# Patient Record
Sex: Female | Born: 1980 | Race: White | Hispanic: Yes | Smoking: Former smoker
Health system: Southern US, Community
[De-identification: ages and names within clinical notes are randomized; demographics above are authoritative.]

## PROBLEM LIST (undated history)

## (undated) ENCOUNTER — Emergency Department (HOSPITAL_COMMUNITY): Payer: Self-pay

## (undated) DIAGNOSIS — N840 Polyp of corpus uteri: Secondary | ICD-10-CM

## (undated) DIAGNOSIS — D649 Anemia, unspecified: Secondary | ICD-10-CM

## (undated) DIAGNOSIS — N809 Endometriosis, unspecified: Secondary | ICD-10-CM

## (undated) DIAGNOSIS — K859 Acute pancreatitis without necrosis or infection, unspecified: Secondary | ICD-10-CM

## (undated) HISTORY — PX: CHOLECYSTECTOMY, LAPAROSCOPIC: SHX56

## (undated) HISTORY — PX: TUBAL LIGATION: SHX77

## (undated) HISTORY — PX: CHOLECYSTECTOMY: SHX55

---
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
## 2001-04-25 ENCOUNTER — Emergency Department (HOSPITAL_COMMUNITY): Admission: EM | Admit: 2001-04-25 | Discharge: 2001-04-25 | Payer: Self-pay

## 2001-10-11 ENCOUNTER — Emergency Department (HOSPITAL_COMMUNITY): Admission: EM | Admit: 2001-10-11 | Discharge: 2001-10-11 | Payer: Self-pay | Admitting: Emergency Medicine

## 2002-04-22 ENCOUNTER — Emergency Department (HOSPITAL_COMMUNITY): Admission: EM | Admit: 2002-04-22 | Discharge: 2002-04-22 | Payer: Self-pay | Admitting: Emergency Medicine

## 2002-04-24 ENCOUNTER — Ambulatory Visit (HOSPITAL_COMMUNITY): Admission: RE | Admit: 2002-04-24 | Discharge: 2002-04-24 | Payer: Self-pay | Admitting: *Deleted

## 2002-04-25 ENCOUNTER — Inpatient Hospital Stay (HOSPITAL_COMMUNITY): Admission: AD | Admit: 2002-04-25 | Discharge: 2002-04-28 | Payer: Self-pay | Admitting: *Deleted

## 2002-04-26 ENCOUNTER — Encounter: Payer: Self-pay | Admitting: *Deleted

## 2002-05-26 ENCOUNTER — Inpatient Hospital Stay (HOSPITAL_COMMUNITY): Admission: AD | Admit: 2002-05-26 | Discharge: 2002-05-26 | Payer: Self-pay | Admitting: *Deleted

## 2002-07-12 ENCOUNTER — Ambulatory Visit (HOSPITAL_COMMUNITY): Admission: RE | Admit: 2002-07-12 | Discharge: 2002-07-12 | Payer: Self-pay | Admitting: *Deleted

## 2002-08-09 ENCOUNTER — Inpatient Hospital Stay (HOSPITAL_COMMUNITY): Admission: AD | Admit: 2002-08-09 | Discharge: 2002-08-09 | Payer: Self-pay | Admitting: *Deleted

## 2002-08-17 ENCOUNTER — Inpatient Hospital Stay (HOSPITAL_COMMUNITY): Admission: AD | Admit: 2002-08-17 | Discharge: 2002-08-17 | Payer: Self-pay | Admitting: Family Medicine

## 2002-08-30 ENCOUNTER — Inpatient Hospital Stay (HOSPITAL_COMMUNITY): Admission: AD | Admit: 2002-08-30 | Discharge: 2002-08-30 | Payer: Self-pay | Admitting: Obstetrics and Gynecology

## 2002-10-03 ENCOUNTER — Inpatient Hospital Stay (HOSPITAL_COMMUNITY): Admission: AD | Admit: 2002-10-03 | Discharge: 2002-10-05 | Payer: Self-pay | Admitting: *Deleted

## 2002-10-03 ENCOUNTER — Encounter (INDEPENDENT_AMBULATORY_CARE_PROVIDER_SITE_OTHER): Payer: Self-pay | Admitting: *Deleted

## 2002-10-06 ENCOUNTER — Encounter: Admission: RE | Admit: 2002-10-06 | Discharge: 2002-11-05 | Payer: Self-pay | Admitting: *Deleted

## 2002-10-12 ENCOUNTER — Inpatient Hospital Stay (HOSPITAL_COMMUNITY): Admission: AD | Admit: 2002-10-12 | Discharge: 2002-10-12 | Payer: Self-pay | Admitting: Family Medicine

## 2002-11-06 ENCOUNTER — Encounter: Admission: RE | Admit: 2002-11-06 | Discharge: 2002-12-06 | Payer: Self-pay | Admitting: *Deleted

## 2002-11-16 ENCOUNTER — Inpatient Hospital Stay (HOSPITAL_COMMUNITY): Admission: EM | Admit: 2002-11-16 | Discharge: 2002-11-18 | Payer: Self-pay | Admitting: Emergency Medicine

## 2002-11-16 ENCOUNTER — Encounter (INDEPENDENT_AMBULATORY_CARE_PROVIDER_SITE_OTHER): Payer: Self-pay | Admitting: *Deleted

## 2002-11-16 ENCOUNTER — Encounter: Payer: Self-pay | Admitting: Emergency Medicine

## 2002-11-16 ENCOUNTER — Emergency Department (HOSPITAL_COMMUNITY): Admission: EM | Admit: 2002-11-16 | Discharge: 2002-11-16 | Payer: Self-pay | Admitting: Emergency Medicine

## 2002-11-16 ENCOUNTER — Encounter: Payer: Self-pay | Admitting: General Surgery

## 2003-01-06 ENCOUNTER — Encounter: Admission: RE | Admit: 2003-01-06 | Discharge: 2003-02-05 | Payer: Self-pay | Admitting: *Deleted

## 2003-02-06 ENCOUNTER — Encounter: Admission: RE | Admit: 2003-02-06 | Discharge: 2003-03-08 | Payer: Self-pay | Admitting: *Deleted

## 2003-03-25 ENCOUNTER — Inpatient Hospital Stay (HOSPITAL_COMMUNITY): Admission: AD | Admit: 2003-03-25 | Discharge: 2003-03-25 | Payer: Self-pay | Admitting: Obstetrics & Gynecology

## 2003-04-08 ENCOUNTER — Encounter: Admission: RE | Admit: 2003-04-08 | Discharge: 2003-05-08 | Payer: Self-pay | Admitting: *Deleted

## 2003-04-14 ENCOUNTER — Emergency Department (HOSPITAL_COMMUNITY): Admission: EM | Admit: 2003-04-14 | Discharge: 2003-04-14 | Payer: Self-pay | Admitting: Emergency Medicine

## 2003-06-08 ENCOUNTER — Encounter: Admission: RE | Admit: 2003-06-08 | Discharge: 2003-07-08 | Payer: Self-pay | Admitting: *Deleted

## 2003-07-03 ENCOUNTER — Inpatient Hospital Stay (HOSPITAL_COMMUNITY): Admission: AD | Admit: 2003-07-03 | Discharge: 2003-07-03 | Payer: Self-pay | Admitting: Obstetrics and Gynecology

## 2003-07-09 ENCOUNTER — Encounter: Admission: RE | Admit: 2003-07-09 | Discharge: 2003-08-08 | Payer: Self-pay | Admitting: *Deleted

## 2003-08-07 ENCOUNTER — Inpatient Hospital Stay (HOSPITAL_COMMUNITY): Admission: AD | Admit: 2003-08-07 | Discharge: 2003-08-08 | Payer: Self-pay | Admitting: Obstetrics & Gynecology

## 2003-08-12 ENCOUNTER — Inpatient Hospital Stay (HOSPITAL_COMMUNITY): Admission: AD | Admit: 2003-08-12 | Discharge: 2003-08-12 | Payer: Self-pay | Admitting: Obstetrics and Gynecology

## 2003-09-06 ENCOUNTER — Encounter: Admission: RE | Admit: 2003-09-06 | Discharge: 2003-10-06 | Payer: Self-pay | Admitting: *Deleted

## 2003-10-07 ENCOUNTER — Ambulatory Visit (HOSPITAL_COMMUNITY): Admission: RE | Admit: 2003-10-07 | Discharge: 2003-10-07 | Payer: Self-pay | Admitting: *Deleted

## 2003-10-14 ENCOUNTER — Other Ambulatory Visit: Admission: RE | Admit: 2003-10-14 | Discharge: 2003-10-14 | Payer: Self-pay | Admitting: *Deleted

## 2003-12-05 ENCOUNTER — Inpatient Hospital Stay (HOSPITAL_COMMUNITY): Admission: AD | Admit: 2003-12-05 | Discharge: 2003-12-08 | Payer: Self-pay | Admitting: Obstetrics and Gynecology

## 2004-01-18 ENCOUNTER — Inpatient Hospital Stay (HOSPITAL_COMMUNITY): Admission: AD | Admit: 2004-01-18 | Discharge: 2004-01-18 | Payer: Self-pay | Admitting: Obstetrics & Gynecology

## 2004-01-23 ENCOUNTER — Inpatient Hospital Stay (HOSPITAL_COMMUNITY): Admission: AD | Admit: 2004-01-23 | Discharge: 2004-01-24 | Payer: Self-pay | Admitting: Obstetrics and Gynecology

## 2004-01-31 ENCOUNTER — Encounter: Admission: RE | Admit: 2004-01-31 | Discharge: 2004-03-01 | Payer: Self-pay | Admitting: Obstetrics and Gynecology

## 2004-02-24 ENCOUNTER — Other Ambulatory Visit: Admission: RE | Admit: 2004-02-24 | Discharge: 2004-02-24 | Payer: Self-pay | Admitting: Obstetrics and Gynecology

## 2004-03-02 ENCOUNTER — Encounter: Admission: RE | Admit: 2004-03-02 | Discharge: 2004-04-01 | Payer: Self-pay | Admitting: Obstetrics and Gynecology

## 2004-03-18 ENCOUNTER — Emergency Department (HOSPITAL_COMMUNITY): Admission: EM | Admit: 2004-03-18 | Discharge: 2004-03-18 | Payer: Self-pay | Admitting: Emergency Medicine

## 2004-06-02 ENCOUNTER — Emergency Department (HOSPITAL_COMMUNITY): Admission: EM | Admit: 2004-06-02 | Discharge: 2004-06-02 | Payer: Self-pay | Admitting: Emergency Medicine

## 2004-10-11 ENCOUNTER — Emergency Department (HOSPITAL_COMMUNITY): Admission: EM | Admit: 2004-10-11 | Discharge: 2004-10-11 | Payer: Self-pay | Admitting: Emergency Medicine

## 2005-06-17 ENCOUNTER — Inpatient Hospital Stay (HOSPITAL_COMMUNITY): Admission: AD | Admit: 2005-06-17 | Discharge: 2005-06-17 | Payer: Self-pay | Admitting: Obstetrics & Gynecology

## 2005-08-01 ENCOUNTER — Ambulatory Visit: Payer: Self-pay | Admitting: Obstetrics and Gynecology

## 2005-08-01 ENCOUNTER — Inpatient Hospital Stay (HOSPITAL_COMMUNITY): Admission: AD | Admit: 2005-08-01 | Discharge: 2005-08-01 | Payer: Self-pay | Admitting: Family Medicine

## 2005-08-17 ENCOUNTER — Inpatient Hospital Stay (HOSPITAL_COMMUNITY): Admission: AD | Admit: 2005-08-17 | Discharge: 2005-08-17 | Payer: Self-pay | Admitting: Obstetrics and Gynecology

## 2005-08-18 ENCOUNTER — Ambulatory Visit: Payer: Self-pay | Admitting: Certified Nurse Midwife

## 2005-08-18 ENCOUNTER — Inpatient Hospital Stay (HOSPITAL_COMMUNITY): Admission: AD | Admit: 2005-08-18 | Discharge: 2005-08-19 | Payer: Self-pay | Admitting: Gynecology

## 2005-08-23 ENCOUNTER — Ambulatory Visit: Payer: Self-pay | Admitting: *Deleted

## 2005-08-23 ENCOUNTER — Encounter (INDEPENDENT_AMBULATORY_CARE_PROVIDER_SITE_OTHER): Payer: Self-pay | Admitting: *Deleted

## 2005-08-30 ENCOUNTER — Inpatient Hospital Stay (HOSPITAL_COMMUNITY): Admission: AD | Admit: 2005-08-30 | Discharge: 2005-08-31 | Payer: Self-pay | Admitting: *Deleted

## 2005-08-30 ENCOUNTER — Ambulatory Visit: Payer: Self-pay | Admitting: Obstetrics and Gynecology

## 2005-09-06 ENCOUNTER — Ambulatory Visit: Payer: Self-pay | Admitting: Obstetrics & Gynecology

## 2005-09-13 ENCOUNTER — Ambulatory Visit: Payer: Self-pay | Admitting: Obstetrics & Gynecology

## 2005-09-17 ENCOUNTER — Inpatient Hospital Stay (HOSPITAL_COMMUNITY): Admission: AD | Admit: 2005-09-17 | Discharge: 2005-09-17 | Payer: Self-pay | Admitting: Gynecology

## 2005-09-17 ENCOUNTER — Ambulatory Visit: Payer: Self-pay | Admitting: Family Medicine

## 2005-09-20 ENCOUNTER — Ambulatory Visit: Payer: Self-pay | Admitting: Obstetrics & Gynecology

## 2005-09-21 ENCOUNTER — Ambulatory Visit: Payer: Self-pay | Admitting: Family Medicine

## 2005-09-21 ENCOUNTER — Inpatient Hospital Stay (HOSPITAL_COMMUNITY): Admission: AD | Admit: 2005-09-21 | Discharge: 2005-09-21 | Payer: Self-pay | Admitting: Obstetrics and Gynecology

## 2005-09-27 ENCOUNTER — Ambulatory Visit: Payer: Self-pay | Admitting: Obstetrics & Gynecology

## 2005-10-04 ENCOUNTER — Ambulatory Visit: Payer: Self-pay | Admitting: Obstetrics & Gynecology

## 2005-10-04 ENCOUNTER — Inpatient Hospital Stay (HOSPITAL_COMMUNITY): Admission: AD | Admit: 2005-10-04 | Discharge: 2005-10-04 | Payer: Self-pay | Admitting: Gynecology

## 2005-10-07 ENCOUNTER — Ambulatory Visit: Payer: Self-pay | Admitting: Gynecology

## 2005-10-07 ENCOUNTER — Inpatient Hospital Stay (HOSPITAL_COMMUNITY): Admission: AD | Admit: 2005-10-07 | Discharge: 2005-10-09 | Payer: Self-pay | Admitting: *Deleted

## 2006-05-14 ENCOUNTER — Emergency Department (HOSPITAL_COMMUNITY): Admission: EM | Admit: 2006-05-14 | Discharge: 2006-05-14 | Payer: Self-pay | Admitting: Emergency Medicine

## 2006-05-24 ENCOUNTER — Inpatient Hospital Stay (HOSPITAL_COMMUNITY): Admission: EM | Admit: 2006-05-24 | Discharge: 2006-05-27 | Payer: Self-pay | Admitting: Emergency Medicine

## 2006-05-31 ENCOUNTER — Inpatient Hospital Stay (HOSPITAL_COMMUNITY): Admission: EM | Admit: 2006-05-31 | Discharge: 2006-06-04 | Payer: Self-pay | Admitting: Emergency Medicine

## 2006-09-01 ENCOUNTER — Emergency Department (HOSPITAL_COMMUNITY): Admission: EM | Admit: 2006-09-01 | Discharge: 2006-09-01 | Payer: Self-pay | Admitting: Emergency Medicine

## 2006-11-05 ENCOUNTER — Emergency Department (HOSPITAL_COMMUNITY): Admission: EM | Admit: 2006-11-05 | Discharge: 2006-11-05 | Payer: Self-pay | Admitting: Emergency Medicine

## 2006-12-09 ENCOUNTER — Emergency Department (HOSPITAL_COMMUNITY): Admission: EM | Admit: 2006-12-09 | Discharge: 2006-12-09 | Payer: Self-pay | Admitting: *Deleted

## 2006-12-16 ENCOUNTER — Emergency Department (HOSPITAL_COMMUNITY): Admission: EM | Admit: 2006-12-16 | Discharge: 2006-12-16 | Payer: Self-pay | Admitting: Emergency Medicine

## 2006-12-27 ENCOUNTER — Inpatient Hospital Stay (HOSPITAL_COMMUNITY): Admission: AD | Admit: 2006-12-27 | Discharge: 2006-12-27 | Payer: Self-pay | Admitting: Obstetrics & Gynecology

## 2006-12-31 ENCOUNTER — Inpatient Hospital Stay (HOSPITAL_COMMUNITY): Admission: AD | Admit: 2006-12-31 | Discharge: 2006-12-31 | Payer: Self-pay | Admitting: Family Medicine

## 2007-01-03 ENCOUNTER — Ambulatory Visit (HOSPITAL_COMMUNITY): Admission: RE | Admit: 2007-01-03 | Discharge: 2007-01-03 | Payer: Self-pay | Admitting: Obstetrics and Gynecology

## 2007-01-24 ENCOUNTER — Inpatient Hospital Stay (HOSPITAL_COMMUNITY): Admission: AD | Admit: 2007-01-24 | Discharge: 2007-01-25 | Payer: Self-pay | Admitting: Obstetrics & Gynecology

## 2007-03-22 ENCOUNTER — Inpatient Hospital Stay (HOSPITAL_COMMUNITY): Admission: AD | Admit: 2007-03-22 | Discharge: 2007-03-22 | Payer: Self-pay | Admitting: Obstetrics and Gynecology

## 2007-05-05 ENCOUNTER — Inpatient Hospital Stay (HOSPITAL_COMMUNITY): Admission: AD | Admit: 2007-05-05 | Discharge: 2007-05-06 | Payer: Self-pay | Admitting: Obstetrics and Gynecology

## 2007-06-13 ENCOUNTER — Inpatient Hospital Stay (HOSPITAL_COMMUNITY): Admission: AD | Admit: 2007-06-13 | Discharge: 2007-06-19 | Payer: Self-pay | Admitting: Obstetrics and Gynecology

## 2007-06-25 ENCOUNTER — Inpatient Hospital Stay (HOSPITAL_COMMUNITY): Admission: AD | Admit: 2007-06-25 | Discharge: 2007-06-25 | Payer: Self-pay | Admitting: Obstetrics

## 2007-07-07 ENCOUNTER — Inpatient Hospital Stay (HOSPITAL_COMMUNITY): Admission: AD | Admit: 2007-07-07 | Discharge: 2007-07-07 | Payer: Self-pay | Admitting: Obstetrics & Gynecology

## 2007-07-16 ENCOUNTER — Observation Stay (HOSPITAL_COMMUNITY): Admission: AD | Admit: 2007-07-16 | Discharge: 2007-07-16 | Payer: Self-pay | Admitting: Obstetrics & Gynecology

## 2007-07-19 ENCOUNTER — Ambulatory Visit (HOSPITAL_COMMUNITY): Admission: RE | Admit: 2007-07-19 | Discharge: 2007-07-19 | Payer: Self-pay | Admitting: Obstetrics & Gynecology

## 2007-07-21 ENCOUNTER — Inpatient Hospital Stay (HOSPITAL_COMMUNITY): Admission: AD | Admit: 2007-07-21 | Discharge: 2007-07-21 | Payer: Self-pay | Admitting: Obstetrics

## 2007-07-25 ENCOUNTER — Encounter: Payer: Self-pay | Admitting: Obstetrics & Gynecology

## 2007-07-25 ENCOUNTER — Inpatient Hospital Stay (HOSPITAL_COMMUNITY): Admission: AD | Admit: 2007-07-25 | Discharge: 2007-07-27 | Payer: Self-pay | Admitting: Obstetrics & Gynecology

## 2007-12-17 ENCOUNTER — Emergency Department (HOSPITAL_COMMUNITY): Admission: EM | Admit: 2007-12-17 | Discharge: 2007-12-17 | Payer: Self-pay | Admitting: Family Medicine

## 2008-11-08 ENCOUNTER — Emergency Department (HOSPITAL_COMMUNITY): Admission: EM | Admit: 2008-11-08 | Discharge: 2008-11-08 | Payer: Self-pay | Admitting: *Deleted

## 2008-11-08 ENCOUNTER — Emergency Department (HOSPITAL_COMMUNITY): Admission: EM | Admit: 2008-11-08 | Discharge: 2008-11-08 | Payer: Self-pay | Admitting: Emergency Medicine

## 2008-11-09 ENCOUNTER — Observation Stay (HOSPITAL_COMMUNITY): Admission: EM | Admit: 2008-11-09 | Discharge: 2008-11-11 | Payer: Self-pay | Admitting: Emergency Medicine

## 2009-01-31 ENCOUNTER — Emergency Department (HOSPITAL_COMMUNITY): Admission: EM | Admit: 2009-01-31 | Discharge: 2009-02-01 | Payer: Self-pay | Admitting: Emergency Medicine

## 2009-04-06 IMAGING — CT CT ABDOMEN W/O CM
2 of 6 series · 13 of 32 positions shown, 19 images · IV contrast (agent unspecified)
Comparison: Abdominal CT 05/25/2006

CLINICAL DATA: Right back and flank pain.  History of cholecystectomy.  Question ureteral calculus.  
ABDOMEN CT WITHOUT CONTRAST:
TECHNIQUE: Multidetector CT imaging of the abdomen was performed following the standard protocol without IV contrast.
TECHNIQUE: Multidetector CT imaging of the pelvis was performed following the standard protocol without IV contrast.

[Series 4: recon 3: use smart ma with max · axial · 0.81mm/px · z∈[-490,-36]mm · 11 of 437 slices shown, 17 images]
[im 37/437  soft-tissue]
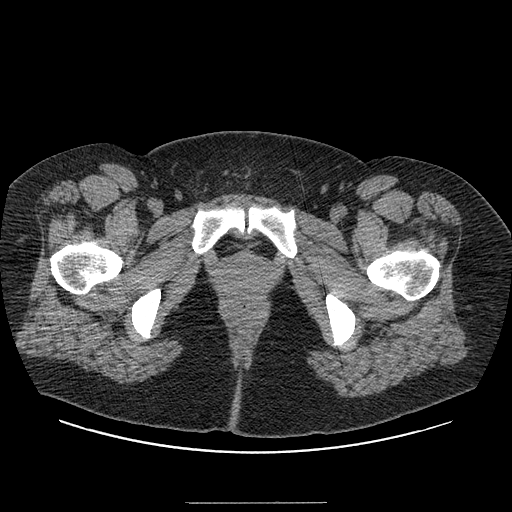
[im 37/437  bone]
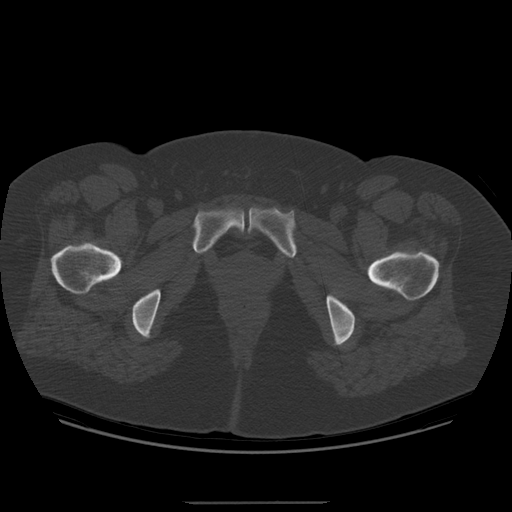
[im 73/437  soft-tissue]
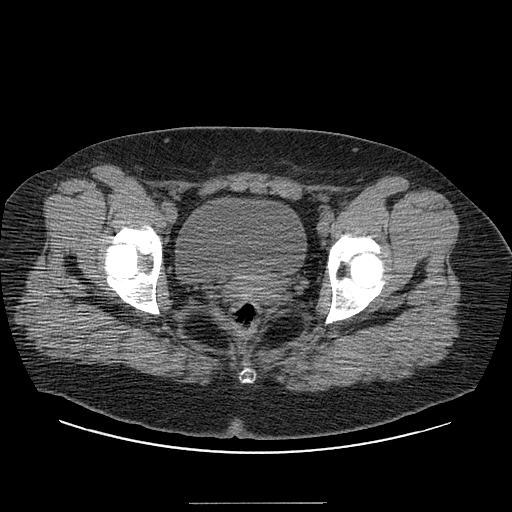
[im 110/437  soft-tissue]
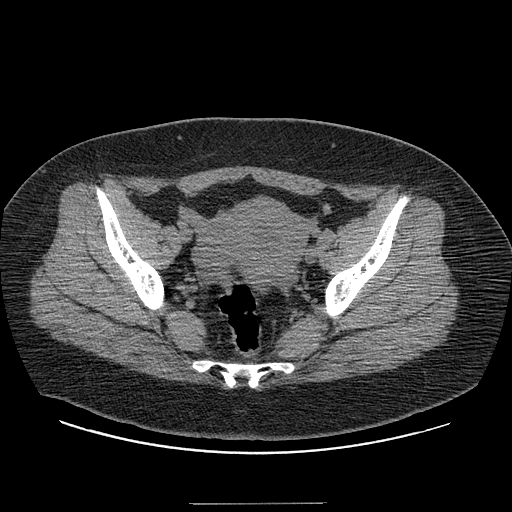
[im 146/437  soft-tissue]
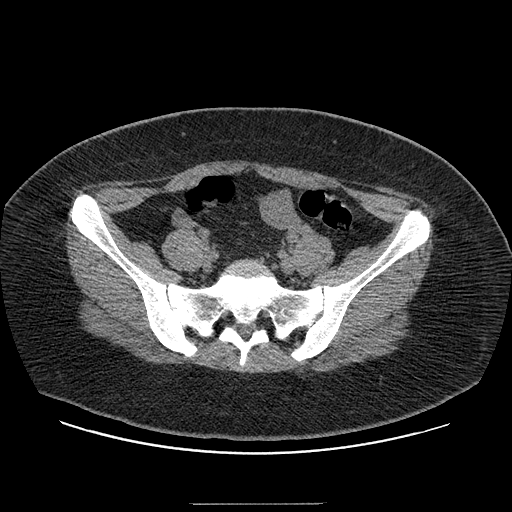
[im 182/437  soft-tissue]
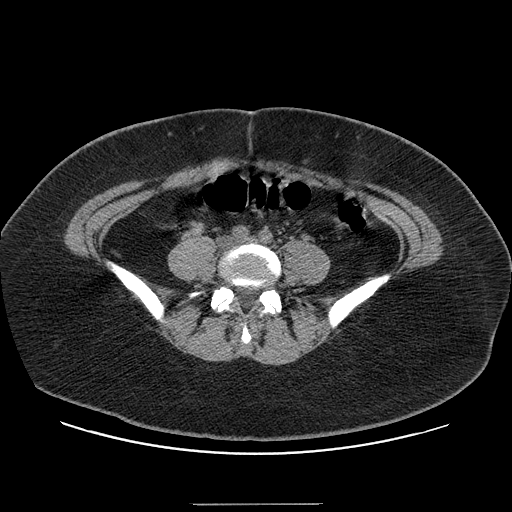
[im 219/437  soft-tissue]
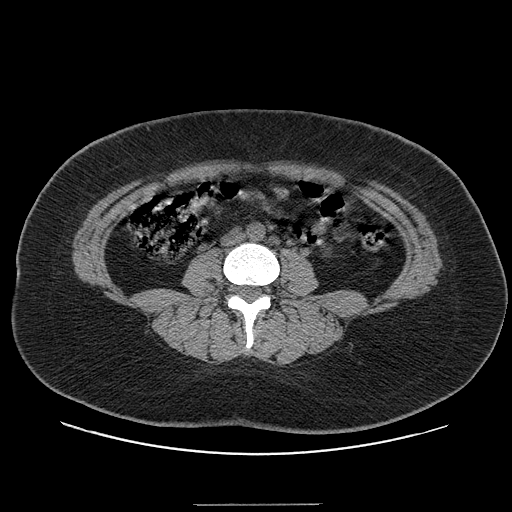
[im 255/437  soft-tissue]
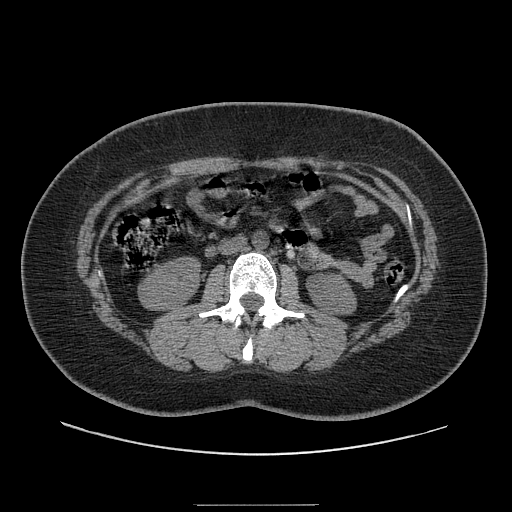
[im 291/437  soft-tissue]
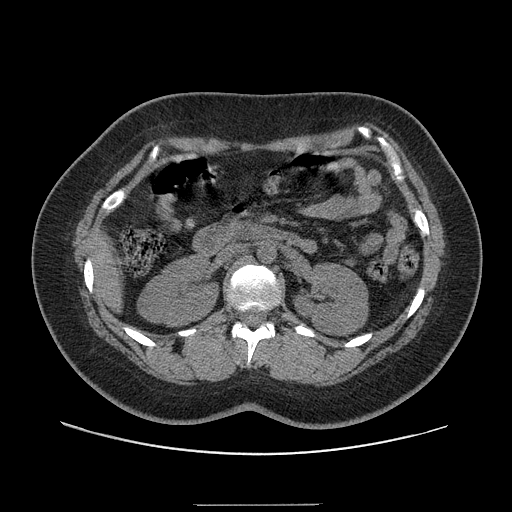
[im 291/437  lung]
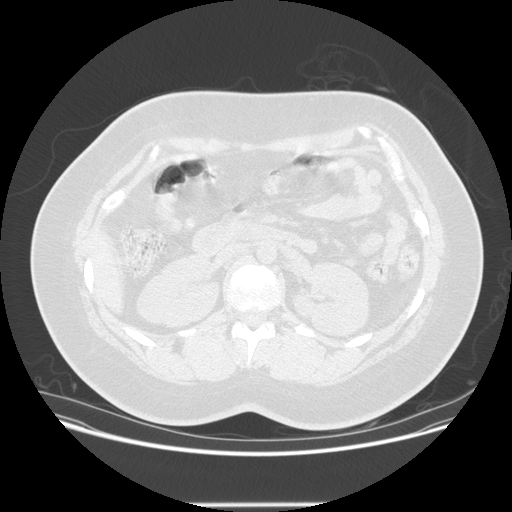
[im 328/437  soft-tissue]
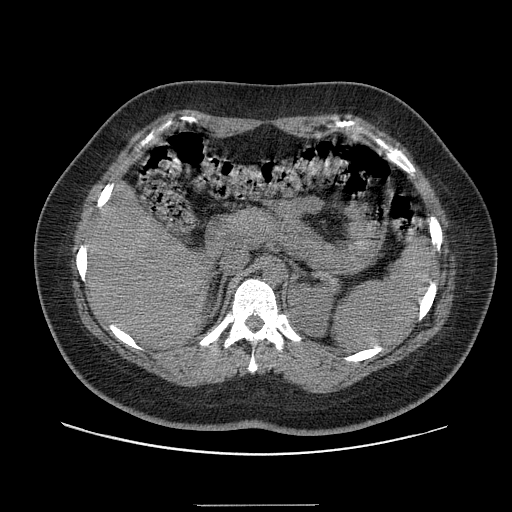
[im 328/437  lung]
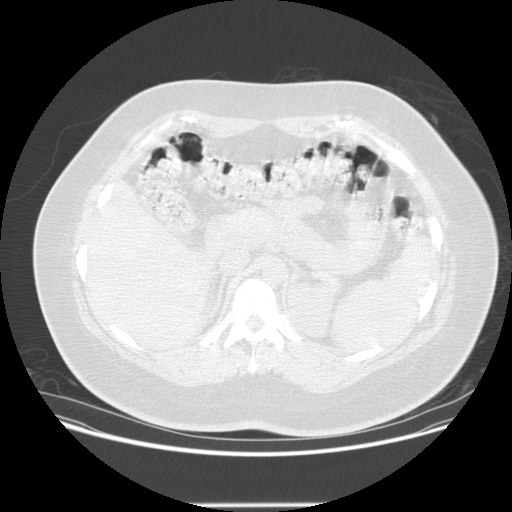
[im 328/437  bone]
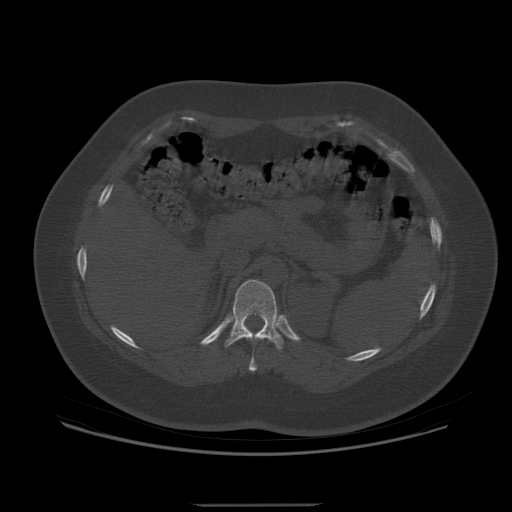
[im 364/437  soft-tissue]
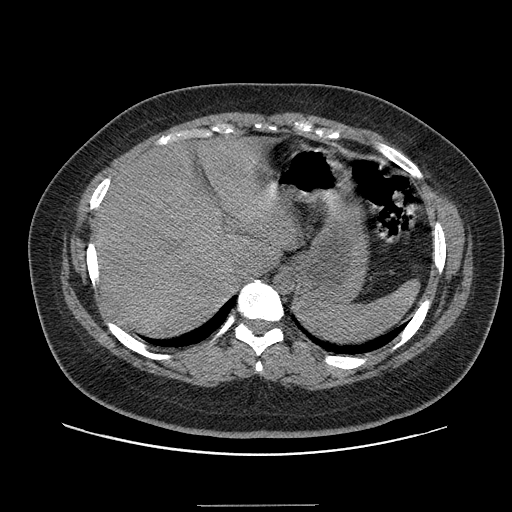
[im 364/437  lung]
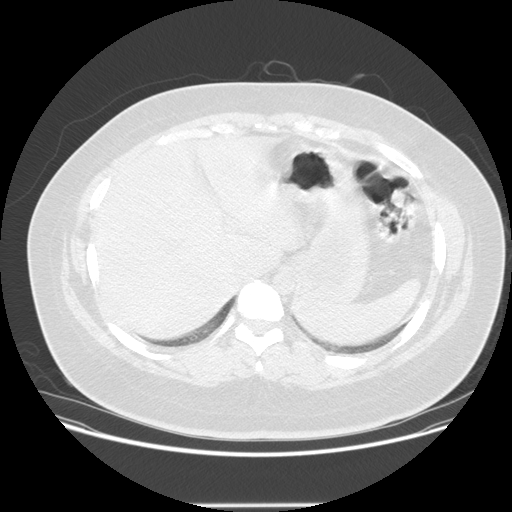
[im 400/437  soft-tissue]
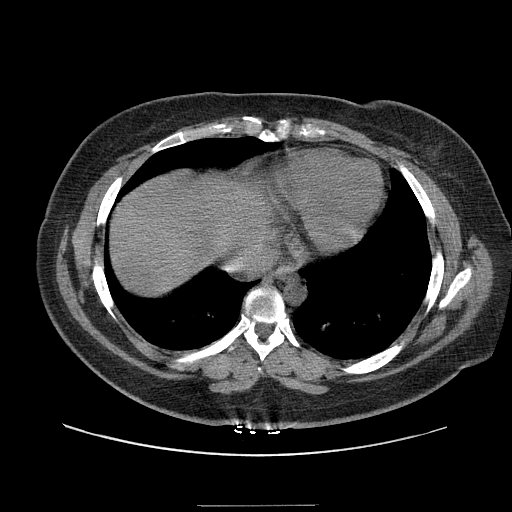
[im 400/437  lung]
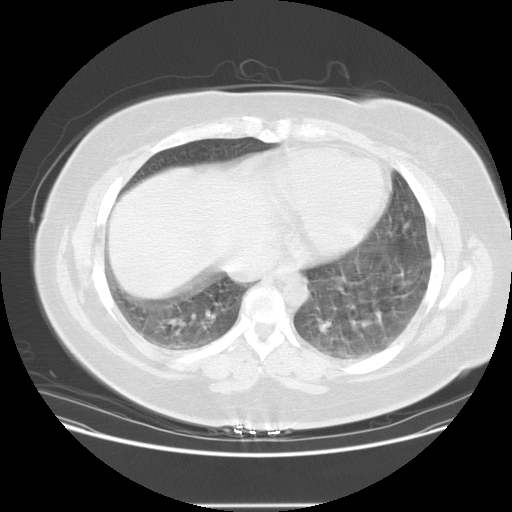

[Series 400: sag · sagittal · 0.80mm/px · 2 of 177 slices shown]
[im 45/177  soft-tissue]
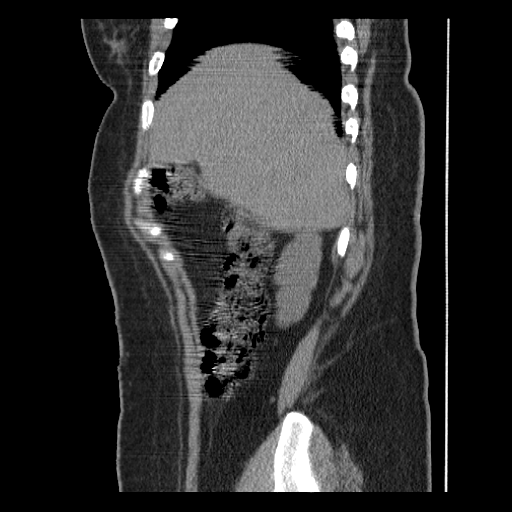
[im 89/177  soft-tissue]
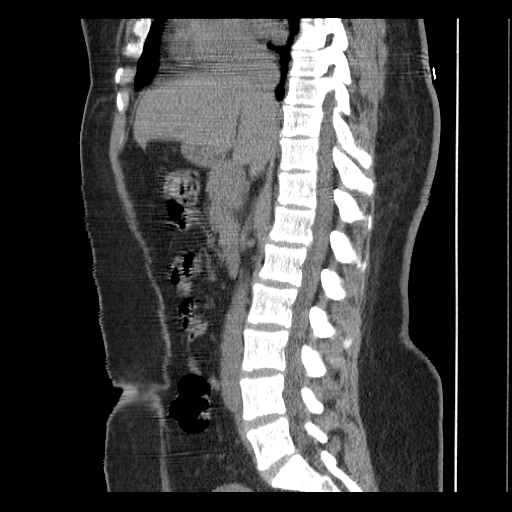

[13 of 32 positions shown; findings below may reference images not displayed]

FINDINGS: The lung bases are now clear.  The kidneys appear normal. No urinary tract calculi are seen.  There is no hydronephrosis or perinephric soft tissue stranding.  The gallbladder is surgically absent.  As imaged in the unenhanced state, the liver, spleen, adrenal glands, and pancreas appear normal.  No intraabdominal inflammatory changes are seen.
IMPRESSION: Negative for urinary tract calculus or hydronephrosis.  No acute abdominal findings are seen.   Bibasilar atelectasis has resolved.  
PELVIS CT WITHOUT CONTRAST:
FINDINGS: Distally, the ureters are normal in caliber.  No ureteral or bladder calculi are seen.  There is no evidence of pelvic inflammatory process.  The free pelvic fluid and right adnexal enlargement noted on the prior examination are no longer present.  The left ovary now appears slightly larger than the right, but the surrounding fat planes are intact, and this is likely physiologic.
IMPRESSION: Negative for ureteral calculus or hydronephrosis.  No acute pelvic findings.

## 2009-04-09 ENCOUNTER — Emergency Department (HOSPITAL_COMMUNITY): Admission: EM | Admit: 2009-04-09 | Discharge: 2009-04-09 | Payer: Self-pay | Admitting: Emergency Medicine

## 2009-04-21 ENCOUNTER — Emergency Department (HOSPITAL_COMMUNITY): Admission: EM | Admit: 2009-04-21 | Discharge: 2009-04-21 | Payer: Self-pay | Admitting: Emergency Medicine

## 2009-11-17 ENCOUNTER — Emergency Department (HOSPITAL_COMMUNITY): Admission: EM | Admit: 2009-11-17 | Discharge: 2009-11-17 | Payer: Self-pay | Admitting: Emergency Medicine

## 2009-11-20 ENCOUNTER — Emergency Department (HOSPITAL_COMMUNITY): Admission: EM | Admit: 2009-11-20 | Discharge: 2009-11-20 | Payer: Self-pay | Admitting: Family Medicine

## 2009-12-09 ENCOUNTER — Emergency Department (HOSPITAL_COMMUNITY): Admission: EM | Admit: 2009-12-09 | Discharge: 2009-12-10 | Payer: Self-pay | Admitting: Emergency Medicine

## 2010-02-01 ENCOUNTER — Emergency Department (HOSPITAL_COMMUNITY): Admission: EM | Admit: 2010-02-01 | Discharge: 2010-02-02 | Payer: Self-pay | Admitting: Emergency Medicine

## 2010-02-01 ENCOUNTER — Emergency Department (HOSPITAL_COMMUNITY): Admission: EM | Admit: 2010-02-01 | Discharge: 2010-02-01 | Payer: Self-pay | Admitting: Family Medicine

## 2010-02-01 ENCOUNTER — Encounter (INDEPENDENT_AMBULATORY_CARE_PROVIDER_SITE_OTHER): Payer: Self-pay | Admitting: *Deleted

## 2010-04-27 ENCOUNTER — Inpatient Hospital Stay (HOSPITAL_COMMUNITY)
Admission: AD | Admit: 2010-04-27 | Discharge: 2010-04-27 | Payer: Self-pay | Source: Home / Self Care | Admitting: Obstetrics & Gynecology

## 2010-05-18 ENCOUNTER — Ambulatory Visit: Payer: Self-pay | Admitting: Obstetrics & Gynecology

## 2010-06-11 ENCOUNTER — Emergency Department (HOSPITAL_COMMUNITY)
Admission: EM | Admit: 2010-06-11 | Discharge: 2010-06-12 | Payer: Self-pay | Source: Home / Self Care | Admitting: Emergency Medicine

## 2010-06-11 LAB — DIFFERENTIAL
Basophils Absolute: 0 10*3/uL (ref 0.0–0.1)
Basophils Relative: 0 % (ref 0–1)
Eosinophils Absolute: 0.1 10*3/uL (ref 0.0–0.7)
Eosinophils Relative: 2 % (ref 0–5)
Lymphocytes Relative: 35 % (ref 12–46)
Lymphs Abs: 2.1 10*3/uL (ref 0.7–4.0)
Monocytes Absolute: 0.6 10*3/uL (ref 0.1–1.0)
Monocytes Relative: 10 % (ref 3–12)
Neutro Abs: 3.2 10*3/uL (ref 1.7–7.7)
Neutrophils Relative %: 54 % (ref 43–77)

## 2010-06-11 LAB — CBC
HCT: 34.3 % — ABNORMAL LOW (ref 36.0–46.0)
Hemoglobin: 11.6 g/dL — ABNORMAL LOW (ref 12.0–15.0)
MCH: 29.4 pg (ref 26.0–34.0)
MCHC: 33.8 g/dL (ref 30.0–36.0)
MCV: 86.8 fL (ref 78.0–100.0)
Platelets: 216 10*3/uL (ref 150–400)
RBC: 3.95 MIL/uL (ref 3.87–5.11)
RDW: 12.4 % (ref 11.5–15.5)
WBC: 6 10*3/uL (ref 4.0–10.5)

## 2010-06-11 LAB — POCT PREGNANCY, URINE: Preg Test, Ur: NEGATIVE

## 2010-06-11 LAB — URINALYSIS, ROUTINE W REFLEX MICROSCOPIC
Bilirubin Urine: NEGATIVE
Ketones, ur: NEGATIVE mg/dL
Nitrite: NEGATIVE
Protein, ur: NEGATIVE mg/dL
Specific Gravity, Urine: 1.005 (ref 1.005–1.030)
Urine Glucose, Fasting: NEGATIVE mg/dL
Urobilinogen, UA: 0.2 mg/dL (ref 0.0–1.0)
pH: 7.5 (ref 5.0–8.0)

## 2010-06-11 LAB — URINE MICROSCOPIC-ADD ON

## 2010-06-12 ENCOUNTER — Emergency Department (HOSPITAL_COMMUNITY)
Admission: EM | Admit: 2010-06-12 | Discharge: 2010-06-12 | Payer: Self-pay | Source: Home / Self Care | Admitting: Emergency Medicine

## 2010-06-12 ENCOUNTER — Encounter (INDEPENDENT_AMBULATORY_CARE_PROVIDER_SITE_OTHER): Payer: Self-pay | Admitting: *Deleted

## 2010-06-12 LAB — COMPREHENSIVE METABOLIC PANEL
ALT: 19 U/L (ref 0–35)
AST: 17 U/L (ref 0–37)
Albumin: 3.6 g/dL (ref 3.5–5.2)
Alkaline Phosphatase: 73 U/L (ref 39–117)
BUN: 10 mg/dL (ref 6–23)
CO2: 30 mEq/L (ref 19–32)
Calcium: 9.3 mg/dL (ref 8.4–10.5)
Chloride: 105 mEq/L (ref 96–112)
Creatinine, Ser: 0.69 mg/dL (ref 0.4–1.2)
GFR calc Af Amer: >60 mL/min (ref >60)
GFR calc non Af Amer: >60 mL/min (ref >60)
Glucose, Bld: 96 mg/dL (ref 70–99)
Potassium: 3.5 mEq/L (ref 3.5–5.1)
Sodium: 140 mEq/L (ref 135–145)
Total Bilirubin: 1.3 mg/dL — ABNORMAL HIGH (ref 0.3–1.2)
Total Protein: 6.4 g/dL (ref 6.0–8.3)

## 2010-06-12 LAB — LIPASE, BLOOD: Lipase: 26 U/L (ref 11–59)

## 2010-07-06 ENCOUNTER — Emergency Department (HOSPITAL_COMMUNITY)
Admission: EM | Admit: 2010-07-06 | Discharge: 2010-07-07 | Payer: Self-pay | Source: Home / Self Care | Admitting: Emergency Medicine

## 2010-07-07 ENCOUNTER — Emergency Department (HOSPITAL_COMMUNITY)
Admission: EM | Admit: 2010-07-07 | Discharge: 2010-07-07 | Payer: Self-pay | Source: Home / Self Care | Admitting: Family Medicine

## 2010-07-09 NOTE — Letter (Signed)
Summary: New Patient letter  [facility]  [address]   [place], [place] [address]   Phone: [phone]  Fax: [phone]       06/12/2010 MRN: [id number]  [facility] [address] [place], [place]  [address]  Dear Ms. Hulsebus,  Welcome to the Gastroenterology Division at [facility].    You are scheduled to see Dr.  Christella Hartigan on 07/20/2010 at 2:00 on the 3rd floor at [facility], [address]  We ask that you try to arrive at our office 15 minutes prior to your appointment time to allow for check-in.  We would like you to complete the enclosed self-administered evaluation form prior to your visit and bring it with you on the day of your appointment.  We will review it with you.  Also, please bring a complete list of all your medications or, if you prefer, bring the medication bottles and we will list them.  Please bring your insurance card so that we may make a copy of it.  If your insurance requires a referral to see a specialist, please bring your referral form from your primary care physician.  Co-payments are due at the time of your visit and may be paid by cash, check or credit card.     Your office visit will consist of a consult with your physician (includes a physical exam), any laboratory testing he/she may order, scheduling of any necessary diagnostic testing (e.g. x-ray, ultrasound, CT-scan), and scheduling of a procedure (e.g. Endoscopy, Colonoscopy) if required.  Please allow enough time on your schedule to allow for any/all of these possibilities.    If you cannot keep your appointment, please call [phone] to cancel or reschedule prior to your appointment date.  This allows [place] the opportunity to schedule an appointment for another patient in need of care.  If you do not cancel or reschedule by 5 p.m. the business day prior to your appointment date, you will be charged a $50.00 late cancellation/no-show fee.    Thank you for choosing  [facility] for your medical needs.  We appreciate the opportunity to care for you.  Please visit [place] at our website  to learn more about our practice.                     Sincerely,                                                             The Gastroenterology Division

## 2010-07-20 ENCOUNTER — Ambulatory Visit: Payer: Self-pay | Admitting: Gastroenterology

## 2010-08-05 ENCOUNTER — Emergency Department (HOSPITAL_COMMUNITY)
Admission: EM | Admit: 2010-08-05 | Discharge: 2010-08-05 | Disposition: A | Discharge status: Home / Self Care | Payer: Self-pay | Attending: Emergency Medicine | Admitting: Emergency Medicine

## 2010-08-05 DIAGNOSIS — A5901 Trichomonal vulvovaginitis: Secondary | ICD-10-CM | POA: Insufficient documentation

## 2010-08-05 DIAGNOSIS — R109 Unspecified abdominal pain: Secondary | ICD-10-CM | POA: Insufficient documentation

## 2010-08-05 DIAGNOSIS — N72 Inflammatory disease of cervix uteri: Secondary | ICD-10-CM | POA: Insufficient documentation

## 2010-08-05 DIAGNOSIS — E0590 Thyrotoxicosis, unspecified without thyrotoxic crisis or storm: Secondary | ICD-10-CM | POA: Insufficient documentation

## 2010-08-05 LAB — URINE MICROSCOPIC-ADD ON

## 2010-08-05 LAB — URINALYSIS, ROUTINE W REFLEX MICROSCOPIC
Specific Gravity, Urine: 1.015 (ref 1.005–1.030)
Urine Glucose, Fasting: NEGATIVE mg/dL
pH: 6.5 (ref 5.0–8.0)

## 2010-08-19 LAB — URINALYSIS, ROUTINE W REFLEX MICROSCOPIC
Glucose, UA: NEGATIVE mg/dL
Hgb urine dipstick: NEGATIVE
Ketones, ur: NEGATIVE mg/dL
Protein, ur: NEGATIVE mg/dL

## 2010-08-19 LAB — POCT PREGNANCY, URINE: Preg Test, Ur: NEGATIVE

## 2010-08-19 LAB — WET PREP, GENITAL: Yeast Wet Prep HPF POC: NONE SEEN

## 2010-08-20 LAB — COMPREHENSIVE METABOLIC PANEL
ALT: 19 U/L (ref 0–35)
AST: 18 U/L (ref 0–37)
Albumin: 4 g/dL (ref 3.5–5.2)
CO2: 25 mEq/L (ref 19–32)
Chloride: 104 mEq/L (ref 96–112)
GFR calc Af Amer: >60 mL/min (ref >60)
GFR calc non Af Amer: >60 mL/min (ref >60)
Sodium: 135 mEq/L (ref 135–145)
Total Bilirubin: 1.5 mg/dL — ABNORMAL HIGH (ref 0.3–1.2)

## 2010-08-20 LAB — CBC
Hemoglobin: 13.5 g/dL (ref 12.0–15.0)
RBC: 4.59 MIL/uL (ref 3.87–5.11)
WBC: 11.4 10*3/uL — ABNORMAL HIGH (ref 4.0–10.5)

## 2010-08-20 LAB — POCT URINALYSIS DIPSTICK
Protein, ur: NEGATIVE mg/dL
Specific Gravity, Urine: 1.02 (ref 1.005–1.030)
Urobilinogen, UA: 4 mg/dL — ABNORMAL HIGH (ref 0.0–1.0)
pH: 8.5 — ABNORMAL HIGH (ref 5.0–8.0)

## 2010-08-20 LAB — DIFFERENTIAL
Eosinophils Absolute: 0.1 10*3/uL (ref 0.0–0.7)
Eosinophils Relative: 0 % (ref 0–5)
Lymphs Abs: 1.4 10*3/uL (ref 0.7–4.0)
Monocytes Absolute: 0.9 10*3/uL (ref 0.1–1.0)

## 2010-08-20 LAB — LIPASE, BLOOD: Lipase: 21 U/L (ref 11–59)

## 2010-08-20 LAB — POCT PREGNANCY, URINE: Preg Test, Ur: NEGATIVE

## 2010-08-23 LAB — POCT PREGNANCY, URINE: Preg Test, Ur: NEGATIVE

## 2010-08-23 LAB — POCT URINALYSIS DIP (DEVICE)
Hgb urine dipstick: NEGATIVE
Ketones, ur: NEGATIVE mg/dL
Protein, ur: NEGATIVE mg/dL
Specific Gravity, Urine: 1.015 (ref 1.005–1.030)
Urobilinogen, UA: 0.2 mg/dL (ref 0.0–1.0)
pH: 6.5 (ref 5.0–8.0)

## 2010-08-23 LAB — WET PREP, GENITAL

## 2010-08-23 LAB — GC/CHLAMYDIA PROBE AMP, GENITAL
Chlamydia, DNA Probe: NEGATIVE
GC Probe Amp, Genital: NEGATIVE

## 2010-08-24 LAB — CBC
Hemoglobin: 12.1 g/dL (ref 12.0–15.0)
MCHC: 33.8 g/dL (ref 30.0–36.0)
Platelets: 205 10*3/uL (ref 150–400)
RDW: 13.2 % (ref 11.5–15.5)

## 2010-08-24 LAB — COMPREHENSIVE METABOLIC PANEL
ALT: 17 U/L (ref 0–35)
Albumin: 3.7 g/dL (ref 3.5–5.2)
Alkaline Phosphatase: 57 U/L (ref 39–117)
Calcium: 8.9 mg/dL (ref 8.4–10.5)
Glucose, Bld: 86 mg/dL (ref 70–99)
Potassium: 3.4 mEq/L — ABNORMAL LOW (ref 3.5–5.1)
Sodium: 139 mEq/L (ref 135–145)
Total Protein: 6.7 g/dL (ref 6.0–8.3)

## 2010-08-24 LAB — URINALYSIS, ROUTINE W REFLEX MICROSCOPIC
Ketones, ur: NEGATIVE mg/dL
Nitrite: NEGATIVE
Protein, ur: NEGATIVE mg/dL
Urobilinogen, UA: 1 mg/dL (ref 0.0–1.0)

## 2010-08-24 LAB — DIFFERENTIAL
Basophils Relative: 1 % (ref 0–1)
Eosinophils Absolute: 0.3 10*3/uL (ref 0.0–0.7)
Lymphs Abs: 2.6 10*3/uL (ref 0.7–4.0)
Monocytes Absolute: 0.6 10*3/uL (ref 0.1–1.0)
Monocytes Relative: 10 % (ref 3–12)
Neutro Abs: 2.3 10*3/uL (ref 1.7–7.7)
Neutrophils Relative %: 40 % — ABNORMAL LOW (ref 43–77)

## 2010-08-24 LAB — POCT PREGNANCY, URINE: Preg Test, Ur: NEGATIVE

## 2010-09-09 LAB — URINALYSIS, ROUTINE W REFLEX MICROSCOPIC
Bilirubin Urine: NEGATIVE
Glucose, UA: NEGATIVE mg/dL
Hgb urine dipstick: NEGATIVE
Ketones, ur: NEGATIVE mg/dL
Nitrite: NEGATIVE
Protein, ur: NEGATIVE mg/dL
Specific Gravity, Urine: 1.021 (ref 1.005–1.030)
Urobilinogen, UA: 0.2 mg/dL (ref 0.0–1.0)
pH: 6 (ref 5.0–8.0)

## 2010-09-09 LAB — URINE MICROSCOPIC-ADD ON

## 2010-09-09 LAB — COMPREHENSIVE METABOLIC PANEL
BUN: 17 mg/dL (ref 6–23)
CO2: 26 mEq/L (ref 19–32)
Calcium: 8.9 mg/dL (ref 8.4–10.5)
Chloride: 106 mEq/L (ref 96–112)
Creatinine, Ser: 0.66 mg/dL (ref 0.4–1.2)
GFR calc non Af Amer: >60 mL/min (ref >60)
Glucose, Bld: 98 mg/dL (ref 70–99)
Total Bilirubin: 1 mg/dL (ref 0.3–1.2)

## 2010-09-09 LAB — DIFFERENTIAL
Basophils Absolute: 0 10*3/uL (ref 0.0–0.1)
Eosinophils Relative: 4 % (ref 0–5)
Lymphocytes Relative: 35 % (ref 12–46)
Lymphs Abs: 2.6 10*3/uL (ref 0.7–4.0)
Neutro Abs: 4 10*3/uL (ref 1.7–7.7)
Neutrophils Relative %: 53 % (ref 43–77)

## 2010-09-09 LAB — PREGNANCY, URINE
Preg Test, Ur: NEGATIVE
Preg Test, Ur: NEGATIVE

## 2010-09-09 LAB — CBC
HCT: 39.7 % (ref 36.0–46.0)
MCHC: 34.1 g/dL (ref 30.0–36.0)
MCV: 91.1 fL (ref 78.0–100.0)
RBC: 4.35 MIL/uL (ref 3.87–5.11)
WBC: 7.4 10*3/uL (ref 4.0–10.5)

## 2010-09-09 LAB — LIPASE, BLOOD: Lipase: 24 U/L (ref 11–59)

## 2010-09-09 LAB — WET PREP, GENITAL

## 2010-09-09 LAB — GC/CHLAMYDIA PROBE AMP, GENITAL: GC Probe Amp, Genital: POSITIVE — AB

## 2010-09-12 LAB — DIFFERENTIAL
Eosinophils Absolute: 0.3 10*3/uL (ref 0.0–0.7)
Lymphs Abs: 2.5 10*3/uL (ref 0.7–4.0)
Monocytes Absolute: 0.6 10*3/uL (ref 0.1–1.0)
Monocytes Relative: 10 % (ref 3–12)
Neutrophils Relative %: 44 % (ref 43–77)

## 2010-09-12 LAB — COMPREHENSIVE METABOLIC PANEL
ALT: 13 U/L (ref 0–35)
Albumin: 3.6 g/dL (ref 3.5–5.2)
Calcium: 8.8 mg/dL (ref 8.4–10.5)
GFR calc Af Amer: >60 mL/min (ref >60)
Glucose, Bld: 90 mg/dL (ref 70–99)
Sodium: 138 mEq/L (ref 135–145)
Total Protein: 6.3 g/dL (ref 6.0–8.3)

## 2010-09-12 LAB — CBC
Hemoglobin: 12.4 g/dL (ref 12.0–15.0)
MCHC: 33.7 g/dL (ref 30.0–36.0)
Platelets: 195 10*3/uL (ref 150–400)
RDW: 14.1 % (ref 11.5–15.5)

## 2010-09-12 LAB — RAPID URINE DRUG SCREEN, HOSP PERFORMED
Amphetamines: NOT DETECTED
Barbiturates: NOT DETECTED
Benzodiazepines: NOT DETECTED
Cocaine: NOT DETECTED
Opiates: POSITIVE — AB
Tetrahydrocannabinol: POSITIVE — AB

## 2010-09-12 LAB — URINE MICROSCOPIC-ADD ON

## 2010-09-12 LAB — URINALYSIS, ROUTINE W REFLEX MICROSCOPIC
Bilirubin Urine: NEGATIVE
Hgb urine dipstick: NEGATIVE
Nitrite: NEGATIVE
Protein, ur: NEGATIVE mg/dL
Urobilinogen, UA: 0.2 mg/dL (ref 0.0–1.0)
pH: 6 (ref 5.0–8.0)

## 2010-09-12 LAB — GLUCOSE, CAPILLARY: Glucose-Capillary: 100 mg/dL — ABNORMAL HIGH (ref 70–99)

## 2010-09-14 LAB — URINALYSIS, ROUTINE W REFLEX MICROSCOPIC
Bilirubin Urine: NEGATIVE
Bilirubin Urine: NEGATIVE
Glucose, UA: NEGATIVE mg/dL
Glucose, UA: NEGATIVE mg/dL
Hgb urine dipstick: NEGATIVE
Ketones, ur: NEGATIVE mg/dL
Ketones, ur: NEGATIVE mg/dL
Protein, ur: NEGATIVE mg/dL
Protein, ur: NEGATIVE mg/dL
Urobilinogen, UA: 1 mg/dL (ref 0.0–1.0)
pH: 7 (ref 5.0–8.0)

## 2010-09-14 LAB — BASIC METABOLIC PANEL
CO2: 25 mEq/L (ref 19–32)
Calcium: 8.4 mg/dL (ref 8.4–10.5)
Creatinine, Ser: 0.53 mg/dL (ref 0.4–1.2)
GFR calc Af Amer: >60 mL/min (ref >60)
GFR calc non Af Amer: >60 mL/min (ref >60)
Sodium: 141 mEq/L (ref 135–145)

## 2010-09-14 LAB — DIFFERENTIAL
Basophils Relative: 1 % (ref 0–1)
Eosinophils Absolute: 0.1 10*3/uL (ref 0.0–0.7)
Eosinophils Relative: 2 % (ref 0–5)
Eosinophils Relative: 2 % (ref 0–5)
Lymphocytes Relative: 32 % (ref 12–46)
Lymphs Abs: 1.8 10*3/uL (ref 0.7–4.0)
Lymphs Abs: 2.1 10*3/uL (ref 0.7–4.0)
Monocytes Relative: 10 % (ref 3–12)
Monocytes Relative: 6 % (ref 3–12)
Neutrophils Relative %: 70 % (ref 43–77)

## 2010-09-14 LAB — COMPREHENSIVE METABOLIC PANEL
ALT: 23 U/L (ref 0–35)
AST: 16 U/L (ref 0–37)
AST: 19 U/L (ref 0–37)
Alkaline Phosphatase: 71 U/L (ref 39–117)
CO2: 27 mEq/L (ref 19–32)
CO2: 30 mEq/L (ref 19–32)
Calcium: 8.7 mg/dL (ref 8.4–10.5)
Calcium: 9.1 mg/dL (ref 8.4–10.5)
Creatinine, Ser: 0.6 mg/dL (ref 0.4–1.2)
GFR calc Af Amer: >60 mL/min (ref >60)
GFR calc Af Amer: >60 mL/min (ref >60)
GFR calc non Af Amer: >60 mL/min (ref >60)
GFR calc non Af Amer: >60 mL/min (ref >60)
Glucose, Bld: 102 mg/dL — ABNORMAL HIGH (ref 70–99)
Potassium: 3.7 mEq/L (ref 3.5–5.1)
Sodium: 134 mEq/L — ABNORMAL LOW (ref 135–145)
Total Protein: 6.2 g/dL (ref 6.0–8.3)
Total Protein: 6.3 g/dL (ref 6.0–8.3)

## 2010-09-14 LAB — CBC
Hemoglobin: 13.1 g/dL (ref 12.0–15.0)
MCHC: 33.2 g/dL (ref 30.0–36.0)
MCHC: 33.5 g/dL (ref 30.0–36.0)
MCHC: 33.6 g/dL (ref 30.0–36.0)
MCV: 87.5 fL (ref 78.0–100.0)
Platelets: 183 10*3/uL (ref 150–400)
RBC: 3.86 MIL/uL — ABNORMAL LOW (ref 3.87–5.11)
RBC: 4.41 MIL/uL (ref 3.87–5.11)
RDW: 14 % (ref 11.5–15.5)

## 2010-09-14 LAB — URINE MICROSCOPIC-ADD ON

## 2010-09-14 LAB — URINE CULTURE: Colony Count: 30000

## 2010-09-14 LAB — LIPASE, BLOOD: Lipase: 26 U/L (ref 11–59)

## 2010-09-14 LAB — POCT PREGNANCY, URINE: Preg Test, Ur: NEGATIVE
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
## 2010-10-20 NOTE — Op Note (Signed)
[name], [name]               ACCOUNT NO.:  [id number]   MEDICAL RECORD NO.:  [id number]          PATIENT TYPE:  INP   LOCATION:  9104                          [facility]ILITY:  [facility]   PHYSICIAN:  Charles A. Clearance Coots, M.D.DATE OF BIRTH:  02-27-1981   DATE OF PROCEDURE:  07/25/2007  DATE OF DISCHARGE:                               OPERATIVE REPORT   PREOPERATIVE DIAGNOSIS:  Elective sterilization.   POSTOPERATIVE DIAGNOSIS:  Elective sterilization.   PROCEDURE:  Bilateral partial salpingectomy.   SURGEON:  Charles A. Clearance Coots, M.D.   ANESTHESIA:  Epidural.   ESTIMATED BLOOD LOSS:  Negligible.   COMPLICATIONS:  None.   SPECIMEN:  Approximately 2 cm segments of right and left fallopian  tubes.   DISPOSITION OF SPECIMEN:  To pathology.   OPERATION:  The patient was brought to operating room and after  satisfactory redosing of the epidural, the abdomen was prepped and  draped in the usual sterile fashion.  A small inferior umbilical  incision was made with the scalpel that was deepened down to the fascia  with curved Mayo scissors bluntly.  The fascia was grasped in the  midline with Kocher forceps and was cut transversely with curved Mayo  scissors.  The incision was extended to the left and to the right with  the curved Mayo scissors.  The peritoneum was grasped with hemostats and  was incised with Metzenbaum scissors.  Right-angle retractors were  placed in the incision.  The right fallopian tube was identified and was  grasped with a Babcock clamp.  The tube was followed from the corneal  end to the fimbrial end and grasped with Babcock clamps.  Knuckle of  tube beneath the Babcock clamp in the isthmic area of the tube was  suture ligated through the mesosalpinx with zero plain catgut.  A  section of tube above the knot was excised with Metzenbaum scissors and  submitted to pathology for evaluation.  There was no active bleeding  from the tubal stump.  It was therefore placed  back in its normal  anatomic position.  The same procedure was performed on the opposite  side without complications.  The abdomen was then closed as follows.  The fascia and peritoneum was closed as one with continuous suture of 2-  0 Vicryl, subcutaneous tissue was approximated with 2-0 Vicryl, the skin  was then closed with continuous subcuticular suture of 3-0 Monocryl.  Sterile bandage was applied to the incision closure.  Surgical  technician indicated that all needle, sponge and instrument counts were  correct x2.  The patient tolerated the procedure well, was transported  to the recovery room in satisfactory condition.      Charles A. Clearance Coots, M.D.  Electronically Signed     CAH/MEDQ  D:  07/25/2007  T:  07/26/2007  Job:  [id number]

## 2010-10-20 NOTE — Discharge Summary (Signed)
Phyllis Wilkins, Phyllis Wilkins               ACCOUNT NO.:  [id number]   MEDICAL RECORD NO.:  [id number]          PATIENT TYPE:  INP   LOCATION:  9152                          [facility]ILITY[facility]  [facility]   PHYSICIAN:  Roseanna Rainbow, M.D.DATE OF BIRTH:  [birth year]   DATE OF ADMISSION:  06/13/2007  DATE OF DISCHARGE:  06/19/2007                               DISCHARGE SUMMARY   CHIEF COMPLAINT:  The patient is a 30 year old gravida 6, para 5 with an  estimated date of confinement of March 11 complaining of uterine  contractions.   HISTORY OF PRESENT ILLNESS:  Please see the above.   ALLERGIES:  No known drug allergies.   MEDICATIONS:  Prenatal vitamins.   RISK FACTORS:  Late entry to care and grand multiparity.  Previous  preterm deliveries.   PRENATAL LABS:  Chlamydia probe negative, GC probe negative, 1 hour GTT  105, hepatitis B surface antigen negative, hemoglobin 11.6, HIV  nonreactive, platelet count 210,000, blood type is O positive, antibody  screen negative, RPR nonreactive, rubella immune, sickle cell negative,  varicella immune.   PAST OBSTETRICAL HISTORY:  In August 2000 she was delivered of a live  born female, 7 pounds 11 ounces, vaginal delivery.  In January 2002 she  was delivered of a live born female, 4 pounds 6 ounces at 33 weeks,  vaginal delivery.  In April of 2004 she was delivered of a 6 pounds female  at 35 weeks, vaginal delivery.  In June of 2005 she was delivered of a  live born female, 6 pounds 2 ounces at 34 weeks, vaginal delivery.  In May  of 2007 she was delivered of a live born female, 5 pounds 2 ounces at 34  weeks, vaginal delivery.   PAST GYN HISTORY:  Noncontributory.   PAST MEDICAL HISTORY:  Iron deficiency anemia, pancreatitis.   PAST SURGICAL HISTORY:  Cholecystectomy.   SOCIAL HISTORY:  She is unemployed, separated from her partner.  Does  not give any significant history of alcohol usage, has no significant  smoking history.  Denies illicit drug  use.   FAMILY HISTORY:  Adult onset diabetes, CVA.   PHYSICAL EXAMINATION:  VITAL SIGNS:  Stable, afebrile.  Fetal heart  tracing reassuring.  Tocodynamometer uterine contractions every 2-5  minutes, sterile vaginal exam per the RN - the cervix is 1 cm dilated  and long.   ASSESSMENT:  Threatened preterm labor without significant cervical  change.  Poor obstetrical history.   PLAN:  Admission, magnesium sulfate tocolysis.   HOSPITAL COURSE:  The patient was admitted.  She was adequately  tocolyzed with the magnesium sulfate.  There were initially labile blood  pressures.  However, these resolved with bedrest.  She was also given  broad spectrum parenteral antibiotics.  The magnesium and clindamycin  were discontinued on January 12.  She remained without uterine  contractions and was discharged to home.   DISCHARGE DIAGNOSES:  Intrauterine pregnancy at 31 plus weeks,  threatened preterm labor.   CONDITION:  Stable.   DIET:  Regular.   ACTIVITY:  Modified bed rest, pelvic rest.   MEDICATIONS:  Resume home medications.   DISPOSITION:  The patient was to follow up in the office on January 15  at 1:50 p.m.      Roseanna Rainbow, M.D.  Electronically Signed     LAJ/MEDQ  D:  07/14/2007  T:  07/16/2007  Job:  [id number]

## 2010-10-23 NOTE — Consult Note (Signed)
[name], Wilkins                           ACCOUNT NO.:  [id number]   MEDICAL RECORD NO.:  [id number]                   PATIENT TYPE:  EMS   LOCATION:  ED                                   [facility]ACILITY:  [facility]   PHYSICIAN:  Sandria Bales. Ezzard Standing, M.D.               DATE OF BIRTH:  1980-07-24   DATE OF CONSULTATION:  11/16/2002  DATE OF DISCHARGE:                                   CONSULTATION   HISTORY OF PRESENT ILLNESS:  This is a 30 year old Hispanic female who comes  to the [facility] Emergency Room. She has no identifying primary medical  doctor. Since yesterday morning, the 10th of June, she has developed  abdominal pain which radiates through to her back. She has had some mild  nausea but no vomiting and no fever. She denies any known history of peptic  ulcer disease, liver disease, pancreatic disease, colon disease. She has had  no prior abdominal surgery. She has had three pregnancies. She has a child 52  years old, 50 years old and 34 weeks old and she is only 30 years old. She is  accompanied by her fiance, Milly Jakob.   PAST MEDICAL HISTORY:  She has no allergies. She is on no medications.   REVIEW OF SYMPTOMS:  PULMONARY:  She does not smoke cigarettes, no history  of pneumonia or tuberculosis. She has no history of heart disease, chest  pain or hypertension. GI:  She has no family history of gallstones that she  knows about or other family GI problems. UROLOGIC:  No kidney stones or  kidney infections. GYN:  She is a gravida 3, para 3 and she had period a  week or two ago.   PHYSICAL EXAMINATION:  VITAL SIGNS:  Temperature 97.7, blood pressure  121/81, pulse 87, respirations 20.  GENERAL:  She is a well-nourished, mildly obese Hispanic female.  HEENT:  Unremarkable. She is in good general health. Her neck is supple. I  feel no masses, no thyromegaly and no lymphadenopathy.  LUNGS:  Clear to auscultation.  HEART:  Regular rate and rhythm and I hear no murmur or rub.  BREASTS:  Not examined.  ABDOMEN:  Soft. She has a very mild soreness in the right upper quadrant.  She has no guarding, no rebound, no palpable mass, no hernia, no other  scars.  EXTREMITIES:  She has good strength in the upper and lower extremities.  NEUROLOGIC:  Grossly intact.   LABORATORY DATA:  Show a negative urine pregnancy.  She has a normal  urinalysis. Her white blood count is 10,600 with 77% neutrophils, 15%  lymphocytes, 7% monocytes. Sodium 142, potassium 4.2, chloride 107, CO2 28,  glucose of 127. Her alkaline phosphatase is 88, total bilirubin 1.7 which is  mildly elevated above 1.2. Her lipase is 26, amylase 89.   A ureteral ultrasound with Stevphen Meuse and this shows multiple small  gallstones, no thickening of the gallbladder wall.   I discussed with the patient and her fiance the options for treatment. I  think she has cholelithiasis and biliary colic. The options are to be  admitted today for surgery later today though I am not on call, Dr.  Zachery Dakins is on call. Another option would be to set her up electively. She  wants to go home at this time. I discussed with her the indications and  potential complications of laparoscopic gallbladder surgery. The potential  complications include but  not limited to bleeding, infection, bile leak, open surgery. She is going to  go home, make arrangements through our office to be scheduled in the next 2-  4 weeks. I told her if she has further symptoms or problems to call our  office and we could have one of our doctors see her.                                               Sandria Bales. Ezzard Standing, M.D.    DHN/MEDQ  D:  11/16/2002  T:  11/16/2002  Job:  [id number]

## 2010-10-23 NOTE — Op Note (Signed)
NAMEHARVEEN, FLESCH                           ACCOUNT NO.:  [id number]   MEDICAL RECORD NO.:  [id number]                   PATIENT TYPE:  INP   LOCATION:  0453                                 FACILITY:  [facility]   PHYSICIAN:  Anselm Pancoast. Zachery Dakins, M.D.          DATE OF BIRTH:  29-Dec-1980   DATE OF PROCEDURE:  11/16/2002  DATE OF DISCHARGE:                                 OPERATIVE REPORT   PREOPERATIVE DIAGNOSIS:  Acute cholecystitis.   POSTOPERATIVE DIAGNOSIS:  Acute cholecystitis.   OPERATION:  Laparoscopic cholecystectomy with cholangiogram.   ANESTHESIA:  General.   SURGEON:  Anselm Pancoast. Zachery Dakins, M.D.   ASSISTANT:  Donnie Coffin. Samuella Cota, M.D.   HISTORY:  This a 30 year old Caucasian female who presented to the emergency  room in the early a.m. with severe epigastric pain.  She is about six weeks  postpartum and was seen by the ER physician.  Laboratory studies showed a  slightly elevated bilirubin, and ultrasound was performed early this morning  that showed gallstones.  Dr. Ezzard Standing saw her and it appeared that her pain  was better.  He dictated a note and released her to be scheduled for  elective cholecystectomy.  Then, shortly after going home she had more pain,  called and was sent back to the emergency room after talking with our  office.   On physical exam she was still slightly uncomfortable.  She was not acute,  as far as any acute abdominal rigid tenderness.  I recommended we proceed on  with a laparoscopic cholecystectomy and cholangiogram.   Preoperatively she was given 3 g of Unasyn, placed in PAS stockings and was  taken to the operative suite.   DESCRIPTION OF PROCEDURE:  The abdomen was prepped, after induction of  general anesthesia, with Betadine surgical solution and draped in a sterile  manner.  A small incision was made below the umbilicus.  She is fairly thin  for six weeks postpartum, and the fascia was picked up between two Kocher  clamps.  A small  opening was made and then carefully entered into the  peritoneal cavity.  A pursestring suture of 0 Vicryl was placed and Hasson  cannula introduced.  The gallbladder was tense and edematous, not acutely  erythematous.  A 10 mm trocar was placed after anesthetizing the fascia, and  two lateral 5 mm trocars were placed in the appropriate position.  The bowel  and bladder were retracted upward.  I was able to grab the proximal portion  of the gallbladder and then teased the cystic duct in the peritoneum,  identifying the cystic artery and also the cystic duct junction with the  gallbladder.  The cystic artery was doubly clipped proximally, singly  distally and divided this anterior branch.  Then the cystic duct was freed  up at its junction with the gallbladder.  This was clipped flush with the  gallbladder, a little opening made, a  Cooke catheter introduced and x-ray  obtained.  At first we could see that the extrahepatic biliary system  appears to be dilated, but there is not any actual blockage at the ampulla  area.  We did visualize the pancreatic duct slightly, and the radiologist  looked at the projections (there were three different views), and feels that  this is not a common duct stone; but, I questioned whether she is passable  and since she has had these little small stones what was often found in her  gallbladder and she did have pain radiating to the back.  The calculi was  removed and clipped the cystic duct, then divided it.  Posterior branch of  the cystic artery was doubly clipped proximally and then this divided.  I  then freed the gallbladder from its bed using first the hook and then  switching to spatula electrocautery.  I brought the gallbladder out through  the umbilicus, and you could feel the little stones.  I then replaced the  Hasson cannula.  We then reinspected the gallbladder bed, but no evidence of any bile leak  and/or bleeding.  We irrigated fluid, aspirated  and then the 5 mm lateral  ports were withdrawn.  I placed a figure-of-eight in addition to the  pursestring of 0 Vicryl at the umbilicus.  I then anesthetized the fascia  here.  The carbon dioxide was released and the upper 10 mm trocar was  withdrawn under direct vision.  The subcutaneous wounds were closed with 4-0  Vicryl, and then Benzoin and Steri-Strips on the skin.  The patient  tolerated the procedure nicely.  Hopefully she will be ready for discharge  in the a.m.   I will repeat a set of liver function study tests and amylase in the  morning, but I do not think there will be any significant abnormality noted.                                               Anselm Pancoast. Zachery Dakins, M.D.    WJW/MEDQ  D:  11/16/2002  T:  11/17/2002  Job:  [id number]

## 2010-10-23 NOTE — Discharge Summary (Signed)
NAMELOVELYN, SHEERAN               ACCOUNT NO.:  [id number]   MEDICAL RECORD NO.:  [id number]          PATIENT TYPE:  INP   LOCATION:  5508                         FACILITY:  [facility]   PHYSICIAN:  Corinna L. Lendell Caprice, MDDATE OF BIRTH:  January 26, 1981   DATE OF ADMISSION:  05/30/2006  DATE OF DISCHARGE:  06/04/2006                               DISCHARGE SUMMARY   DISCHARGE DIAGNOSES:  1. Acute pancreatitis.  2. 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  3. Left shoulder pain.  4. Reported chronic diarrhea, none during this hospitalization.  5. Anxiety disorder.  6. Obesity.   DISCHARGE MEDICATIONS:  Continue Celexa 10 mg a day, Ativan 1 mg p.o.  t.i.d. as needed, Tylenol or Motrin for pain, Creon 10 mg, 1-2 tablets  p.o. q.a.c. p.r.n. loose stool.   CONDITION:  Stable.   CONSULTATIONS:  None.   PROCEDURES:  None.   PERTINENT LABORATORIES:  CBC on admission was within normal limits.  Her  potassium on admission with 3.2, otherwise, unremarkable basic metabolic  panel. Her total bilirubin was 1.7 but normal LFTs, otherwise. Amylase  was 202 on admission, lipase 104. The following day, her lipase  normalized to 41.  Urine pregnancy negative.  UA showed moderate  hemoglobin, small leukocyte esterase, negative nitrite, 3-6 white cells,  7-10 red cells. Abdominal ultrasound showed no ductal dilatation, prior  cholecystectomy, pancreas not well seen due to bowel gas.   HISTORY AND HOSPITAL COURSE:  Ms. Hoffert is a 30 year old Hispanic  unassigned white female who was readmitted to the hospital with  abdominal pain.  She had recently been admitted and discharged from  [facility] with left flank pain, back pain, for which an  organic cause could not be found. During her previous hospitalization,  she had had a surgical consult and psychiatric consult, two CAT scans of  the abdomen and pelvis and pelvic  ultrasound.  During her last  hospitalization, she would day was found to have chlamydia and bacterial  vaginosis and it was felt that she potentially could have had a low  grade PID but had no cervical motion tenderness.  She had been  discharged to home on Ativan as needed, Celexa, Etanidazole.  During her  last hospitalization, her pain was improved more from benzodiazepines  than opiate analgesics which prompted a psychiatry consult. The  psychiatric nurse practitioner noted that the patient did seem to have  an anxiety disorder and had a lot of life stresses and felt that a  somatoform disorder was, in fact, possible.   Nevertheless, the patient returned to the emergency room with back pain  and epigastric pain. She denied any of vomiting. She has had a  laparoscopic cholecystectomy in the past and had some upper abdominal  tenderness with soft abdomen.  She was found to have pancreatitis by  enzymes.  On the ultrasound, the pancreas could not be visualized.  The  patient was made n.p.o., started on pain medications which helped. At  that time of discharge, she was  tolerating a regular diet and reported  that her abdominal pain had improved.  She continued to have back pain,  however, and also complained of left shoulder pain which she thought was  from the IV. She has been off IV medications for 24 hours and reports  that Vicodin does not help her pain, but feels ready to go home.  On the  day of discharge, she also complained of a three year history of loose  stools after eating. It is possible that she may have had a low grade  recurrent pancreatitis and may have some pancreatic insufficiency, but  she never had any calcifications on previous CAT scans. Nevertheless,  she wishes to try pancreatic enzymes which I have prescribed. I have  recommended that she follow up with [facility] for her recurrent pain  issues, psychiatric issues, and gastrointestinal issues. At this point,   I do not need feel that she meets the criteria for further  hospitalization. She also, during her last hospitalization, was  instructed to follow up with psychiatry at [facility], but I do not  believe that she has done so, so I recommended that she call to  reschedule her appointment.  I believe she was readmitted prior to  making her appointment.      Corinna L. Lendell Caprice, MD  Electronically Signed     CLS/MEDQ  D:  06/04/2006  T:  06/04/2006  Job:  [id number]
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
## 2010-10-23 NOTE — Discharge Summary (Signed)
Phyllis Wilkins, Phyllis Wilkins                           ACCOUNT NO.:  [id number]   MEDICAL RECORD NO.:  [id number]                   PATIENT TYPE:  INP   LOCATION:  9162                                 FACILITY:  [facility]   PHYSICIAN:  Guy Sandifer. Arleta Creek, M.D.           DATE OF BIRTH:  10/03/1980   DATE OF ADMISSION:  12/05/2003  DATE OF DISCHARGE:  12/08/2003                                 DISCHARGE SUMMARY   ADMITTING DIAGNOSES:  1. Intrauterine pregnancy at 8 weeks estimated gestational age.  2. Preterm labor.   DISCHARGE DIAGNOSES:  1. Intrauterine pregnancy at 81 weeks estimated gestational age.  2. Preterm labor.   REASON FOR ADMISSION:  This patient is a 30 year old married Hispanic female  G4 P3 with a history of two preterm deliveries at approximately 32 weeks who  presents with uterine contractions for 2 days.  Cervix is 2 cm dilated on  admission.  Vital signs are stable.   HOSPITAL COURSE:  The patient is admitted to the hospital, placed on  intravenous magnesium sulfate tocolysis.  Uterine contractions stop.  She  does receive betamethasone 12.5 mg IM for two doses.  She was also stared on  Unasyn intravenously.  Ultrasound on December 06, 2003 reveals a breech  presentation with an AFI of 12.5 which is within normal limits.  Estimated  fetal weight is 1248 g up to 1392 g which is the 50th to the 90th  percentile.  Placenta is posterior and grade 1.  She responds well to the  magnesium sulfate.  On December 07, 2003 she is converted from magnesium sulfate  to oral terbutaline on a scheduled basis.  On the evening of December 07, 2003  she complains again of back pain with the prolonged bedrest.  She is treated  with Stadol and Phenergan.  On the day of discharge fetal heart tones are  reactive.  She is not contracting and is feeling well.   CONDITION ON DISCHARGE:  Good.   DIET:  Regular as tolerated.   ACTIVITY:  The need for bedrest with bathroom privileges is emphasized.  The  patient  acknowledges understanding.   She will call for increased contractions, decreased fetal movement, change  in vaginal discharge.  Ultrasound was also done in the room prior to  discharge and it reveals a transverse lie, back down.  The contraindication  to labor as well as the increased risk for cord prolapse and stillbirth is  discussed with the patient.  The need to notify us immediately and to  present to the hospital if she has any leaking of fluid is also emphasized  and understanding is acknowledged.   MEDICATIONS:  1. Terbutaline 2.5 mg #50 one p.o. q.4h. with two refills.  The patient is     instructed on checking her pulse prior to     taking the medication.  2. Prenatal vitamins daily.   Follow-up is in  the office within the week.                                               Guy Sandifer Arleta Creek, M.D.    JET/MEDQ  D:  12/08/2003  T:  12/09/2003  Job:  [id number]

## 2010-10-23 NOTE — Discharge Summary (Signed)
   Phyllis Wilkins, Phyllis Wilkins                           ACCOUNT NO.:  [id number]   MEDICAL RECORD NO.:  [id number]                   PATIENT TYPE:   LOCATION:                                       FACILITY:  [facility]   PHYSICIAN:  Tamika J. Lazarus Salines, M.D.                DATE OF BIRTH:  Mar 28, 1981   DATE OF ADMISSION:  04/25/2002  DATE OF DISCHARGE:  04/28/2002                                 DISCHARGE SUMMARY   DISCHARGE DIAGNOSES:  1. Intrauterine pregnancy at 13 weeks.  2. Pyelonephritis.   DISCHARGE MEDICATIONS:  1. Amoxicillin 500 mg t.i.d. for eight days.  2. Prenatal vitamins daily.   FOLLOW UP:  Followup appointment at [facility] on 05/02/2002.   HISTORY OF PRESENT ILLNESS:  Please see full H&P for details.  In brief, a  30 year old, gravida 3, para 1-1-0-2, who presented at 12 weeks 6 days with  onset of lower abdominal pain, back pain, fever, and burning with urination.  On examination, she was noted to have a temperature of 101.4, pulse 115,  blood pressure 128/60, fetal heart rate 160s.  She had suprapubic tenderness  on palpation, right CVA tenderness, as well as a frothy, yellow discharge  and cervical motion tenderness.  Labs on admission included WBC 9.1,  hemoglobin 11.4, platelets 165, BUN 7, creatinine 0.6, AST 9, ALT less than  19, total bili 2.5.  Urinalysis revealed greater than 80 ketones and small  bilirubin with a specific gravity of greater than 1.030, negative nitrite,  negative GC and Chlamydia.  Patient was admitted for treatment of  pyelonephritis.   HOSPITAL COURSE:  Patient was started on treatment with IV ampicillin and IV  gentamicin.  Two days into the admission, patient continued to have  abdominal pain, particularly in her right upper quadrant, and abdominal  ultrasound was obtained, revealing no evidence for cholelithiasis.  Ultrasound did show mild splenomegaly.  Patient was treated aggressively  with IV fluids.  Her symptoms gradually improved.  By  day of discharge, she  was afebrile with no further complaints of pain.  She was started on p.o.  amoxicillin to complete a 10-day course.  Fetal heart rate was monitored by  Doppler throughout hospitalization and remained stable in the 140 to 150s.  Patient was discharged to home in stable condition.   DISCHARGE INSTRUCTIONS:  Patient was provided with preterm labor precautions  at discharge.                                               Tamika J. Lazarus Salines, M.D.    Phyllis Wilkins  D:  10/18/2002  T:  10/18/2002  Job:  [id number]

## 2010-10-23 NOTE — H&P (Signed)
Phyllis Wilkins, Phyllis Wilkins               ACCOUNT NO.:  [id number]   MEDICAL RECORD NO.:  [id number]          PATIENT TYPE:  INP   LOCATION:  1320                         FACILITY:  [facility]   PHYSICIAN:  Jackie Plum, M.D.DATE OF BIRTH:  04-Jun-1981   DATE OF ADMISSION:  05/23/2006  DATE OF DISCHARGE:                              HISTORY & PHYSICAL   CHIEF COMPLAINT:  Left flank pain, back pain and left lower quadrant  pain.   HISTORY OF PRESENT ILLNESS:  The patient is a 30 year old lady with no  previous significant medical history, who presented with left flank pain  as well as left lower quadrant pain associated with some nausea and  vomiting.  She had some loose stools without any fever or chills.  No  dysuria or frequency of micturition.  The patient denies any previous  similar abdominal symptoms.  No melena or hematochezia.  No vaginal  discharge.  In the ER, the patient was seen by  Dr. Linwood Dibbles.  Pelvic  exam was unremarkable for any cervical excitation motion tenderness.  CT  of the abdomen and pelvis was done, which showed no acute changes of  significance to explain her symptoms.  The patient was eventually seen  by the surgeon on call, who felt that her colicky pain could be due to  several things including ureterolithiasis as well as possibly ovarian  torsion.  She had an ultrasound done in the ER which ruled out  essentially ovarian torsion, though it was a suboptimal study.  The  patient is admitted to the medical service for pain control at this  point.   PAST MEDICAL HISTORY:  Notable for history of cholelithiasis and  nephrolithiasis.  She is status post laparoscopic cholecystectomy.   MEDICATIONS:  She is not on any regular medications except for Vicodin  started recently for her pain.   ALLERGIES:  She does not have any medication allergies.   SOCIAL HISTORY:  The patient has 5 children; her last child is about 39  months of age.  She does not smoke cigarettes  nor drink alcohol.   REVIEW OF SYSTEMS:  As noted above and otherwise unremarkable.   PHYSICAL EXAMINATION:  GENERAL:  Notable for a young Hispanic who is  found to be lying in bed in moderate to severe painful distress,  recently writhing in pain.  As stated above, she was moderately obese.  VITAL SIGNS:  BP 157/82, pulse 118, respirations 24, temperature 98.4  degrees Fahrenheit, O2 SAT 98% on room air.  HEENT:  Normocephalic, atraumatic.  Pupils equal, round and reactive to  light.  Extraocular movements intact.  Oropharynx moist.  NECK:  Supple.  No JVD.  LUNGS:  Clear to auscultation.  CARDIAC:  Regular rate and rhythm, no gallops or murmur.  ABDOMEN:  Obese, soft.  Some mild right lower quadrant pain without any  significant guarding or rebound tenderness noted.  She had left  costovertebral angle tenderness noted; some mild right costovertebral  angle tenderness was also noted.  BACK:  There were no paraspinal muscle spasms noted.  EXTREMITIES:  No cyanosis.  CNS:  Exam nonfocal.   LABORATORY DATA REVIEWED:  Sodium 134, potassium 4.3, chloride 99, CO2  26, BUN 10, creatinine 0.6, glucose 100.  Urine pregnancy test was  negative.   CT scan as noted above.   UA:  Appearance was cloudy, color yellow, pH of 7.5, specific gravity of  1.008, negative glucose, hemoglobin, bilirubin, ketones, total protein  and nitrites, leukocyte esterase was small, wbc's 3-6 and she had a few  bacteria.  Her liver function testing indicating bilirubin of 2.9.  Lipase 19.  Point-of-care cardiac markers were negative.   IMPRESSION:  Flank pain and lower abdominal pain of unclear etiology.  I  suspect that the patient may have renal colic.  Would repeat the CT scan  in the morning with renal stone or kidney stone protocol if that was not  used in her current study.  We will try Toradol and if not improved, we  will switch her back to Dilaudid.  She may need to be put on patient-  controlled  analgesic pump.  We will give her some antiemetics and  antinauseants for supportive care.   The patient's mother is very frustrated in her daughter's status.  She  is unhappy that no one has been able to tell her categorically what was  going on, including that of 2 previous doctors.  She kept asking me on  several occasions what was the cause of her pain and I kept on telling  her that I did not know for sure, that it could be due to several  differentials noted including torsion of the ovary as well as renal  colic and she was very upset with that and insisted why no one had been  able to tell her this is the cause of the patient's pain and was  shouting that she wanted the patient's pain to be taken care of.  With  this, the patient's mother's behavior is understandable; obviously, she  is very unhappy and I explained to her that we will do our best to try  to relieve her pain.      Jackie Plum, M.D.  Electronically Signed     GO/MEDQ  D:  05/24/2006  T:  05/24/2006  Job:  [id number]

## 2010-10-23 NOTE — H&P (Signed)
Phyllis Wilkins, [name]               ACCOUNT NO.:  [id number]   MEDICAL RECORD NO.:  [id number]          PATIENT TYPE:  INP   LOCATION:  5511                         FACILITY:  [facility]   PHYSICIAN:  Jackie Plum, M.D.DATE OF BIRTH:  [birth year]   DATE OF ADMISSION:  05/30/2006  DATE OF DISCHARGE:                              HISTORY & PHYSICAL   ADMISSION DIAGNOSES:  1. Acute pancreatitis.  2. Abdominal pain secondary to #1.  3. Hypokalemia.  4. Obesity.  5. Depression.   CHIEF COMPLAINT:  Abdominal pain.   The patient is a 30 year old Hispanic lady who was recently admitted to  the hospitalist service and discharged last Thursday.  She presented  with acute waxing and waning abdominal pain.  The pain is not radiating.  It is 10/10 at its peak.  No known aggravating or alleviating factors.  Has been nauseated without vomiting.  She has not had any dysuria or  frequency of micturition.  The patient came to the ER because of  worsening symptoms.  She denies any history of fever or chills,  diaphoresis, visual changes, extremity weakness, sore throat, chest  pain, shortness of breath.  She has back pain.  She does not have any  headaches or dizziness.   PAST MEDICAL HISTORY:  History of cholelithiasis, status post  laparoscopic cholecystectomy.   MEDICATION HISTORY:  She does not have any known medication allergies.   She is not on any regular medication at this time.   SOCIAL HISTORY:  The patient does not smoke cigarettes.  She does not  drink alcohol.   REVIEW OF SYSTEMS:  As noted above, otherwise unremarkable.   PHYSICAL EXAMINATION:  VITAL SIGNS:  Vital signs were stable and the  patient was afebrile.  HEENT:  Normocephalic, atraumatic.  Pupils equal, round, and reactive to  light.  Extraocular movements intact.  Oropharynx moist.  NECK:  Supple, no JVD.  CARDIOPULMONARY:  Auscultation unremarkable.  ABDOMEN:  Obese, soft, with epigastric and upper abdominal  tenderness  without any rebound tenderness.  Bowel sounds are present and are  normoactive.  EXTREMITIES:  No cyanosis.  CENTRAL NERVOUS SYSTEM:  Nonfocal.   LABORATORY DATA:  CBC was essentially within normal limits.  Chemistry  as noted above with sodium of 134, potassium of 3.2, with bilirubin of  1.7 and amylase level of 104, lipase of 202.  Rest of complete metabolic  panel also was normal limits including transaminases.  UA indicated rare  bacteria with 3-6 wbc's per high-power field.   IMPRESSION:  Acute pancreatitis.   The patient is admitted for bowel rest, IV fluids, analgesics and  supportive care.      Jackie Plum, M.D.  Electronically Signed     GO/MEDQ  D:  05/31/2006  T:  05/31/2006  Job:  [id number]
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
## 2011-01-04 ENCOUNTER — Emergency Department (HOSPITAL_COMMUNITY)
Admission: EM | Admit: 2011-01-04 | Discharge: 2011-01-04 | Discharge status: Against Medical Advice | Payer: Medicaid Other | Attending: Emergency Medicine | Admitting: Emergency Medicine

## 2011-01-04 ENCOUNTER — Inpatient Hospital Stay (INDEPENDENT_AMBULATORY_CARE_PROVIDER_SITE_OTHER): Admit: 2011-01-04 | Discharge: 2011-01-04 | Disposition: A | Discharge status: Home / Self Care | Payer: Self-pay

## 2011-01-04 DIAGNOSIS — M549 Dorsalgia, unspecified: Secondary | ICD-10-CM | POA: Insufficient documentation

## 2011-01-04 DIAGNOSIS — R109 Unspecified abdominal pain: Secondary | ICD-10-CM | POA: Insufficient documentation

## 2011-01-04 DIAGNOSIS — N949 Unspecified condition associated with female genital organs and menstrual cycle: Secondary | ICD-10-CM

## 2011-01-04 DIAGNOSIS — R42 Dizziness and giddiness: Secondary | ICD-10-CM | POA: Insufficient documentation

## 2011-01-04 DIAGNOSIS — N898 Other specified noninflammatory disorders of vagina: Secondary | ICD-10-CM

## 2011-01-04 LAB — POCT URINALYSIS DIP (DEVICE)
Glucose, UA: NEGATIVE mg/dL
Ketones, ur: NEGATIVE mg/dL
Leukocytes, UA: NEGATIVE
Specific Gravity, Urine: >=1.03 (ref 1.005–1.030)
Specific Gravity, Urine: >=1.03 (ref 1.005–1.030)
pH: 6 (ref 5.0–8.0)

## 2011-01-04 LAB — URINALYSIS, ROUTINE W REFLEX MICROSCOPIC
Bilirubin Urine: NEGATIVE
Ketones, ur: NEGATIVE mg/dL
Leukocytes, UA: NEGATIVE
Nitrite: NEGATIVE
Specific Gravity, Urine: 1.028 (ref 1.005–1.030)
Urobilinogen, UA: 1 mg/dL (ref 0.0–1.0)

## 2011-01-04 LAB — POCT I-STAT, CHEM 8
BUN: 14 mg/dL (ref 6–23)
Creatinine, Ser: 0.7 mg/dL (ref 0.50–1.10)
Potassium: 3.9 mEq/L (ref 3.5–5.1)
Sodium: 143 mEq/L (ref 135–145)

## 2011-01-04 LAB — WET PREP, GENITAL: Trich, Wet Prep: NONE SEEN

## 2011-01-04 LAB — POCT PREGNANCY, URINE
Preg Test, Ur: NEGATIVE
Preg Test, Ur: NEGATIVE

## 2011-01-04 LAB — URINE MICROSCOPIC-ADD ON

## 2011-01-05 LAB — GC/CHLAMYDIA PROBE AMP, GENITAL
Chlamydia, DNA Probe: NEGATIVE
GC Probe Amp, Genital: NEGATIVE

## 2011-02-06 ENCOUNTER — Inpatient Hospital Stay (INDEPENDENT_AMBULATORY_CARE_PROVIDER_SITE_OTHER)
Admission: RE | Admit: 2011-02-06 | Discharge: 2011-02-06 | Disposition: A | Discharge status: Home / Self Care | Payer: Medicaid Other | Source: Ambulatory Visit | Attending: Family Medicine | Admitting: Family Medicine

## 2011-02-06 DIAGNOSIS — N390 Urinary tract infection, site not specified: Secondary | ICD-10-CM

## 2011-02-06 DIAGNOSIS — M545 Low back pain: Secondary | ICD-10-CM

## 2011-02-06 DIAGNOSIS — N760 Acute vaginitis: Secondary | ICD-10-CM

## 2011-02-06 LAB — WET PREP, GENITAL

## 2011-02-06 LAB — POCT URINALYSIS DIP (DEVICE)
Protein, ur: 30 mg/dL — AB
Specific Gravity, Urine: >=1.03 (ref 1.005–1.030)
Urobilinogen, UA: 1 mg/dL (ref 0.0–1.0)

## 2011-02-06 LAB — POCT PREGNANCY, URINE: Preg Test, Ur: NEGATIVE

## 2011-02-08 LAB — URINE CULTURE
Colony Count: >=100000
Culture  Setup Time: 201209011318

## 2011-02-22 ENCOUNTER — Inpatient Hospital Stay (HOSPITAL_COMMUNITY)
Admission: RE | Admit: 2011-02-22 | Discharge: 2011-02-22 | Disposition: A | Discharge status: Home / Self Care | Payer: Medicaid Other | Source: Ambulatory Visit | Attending: Family Medicine | Admitting: Family Medicine

## 2011-02-25 LAB — DIFFERENTIAL
Eosinophils Relative: 0
Lymphocytes Relative: 21
Lymphs Abs: 1.4
Monocytes Absolute: 0.4
Neutro Abs: 4.8

## 2011-02-25 LAB — STREP B DNA PROBE: Strep Group B Ag: NEGATIVE

## 2011-02-25 LAB — COMPREHENSIVE METABOLIC PANEL
AST: 12
Albumin: 2.7 — ABNORMAL LOW
BUN: 4 — ABNORMAL LOW
Calcium: 8.4
Creatinine, Ser: 0.42
GFR calc Af Amer: >60
Total Protein: 5.9 — ABNORMAL LOW

## 2011-02-25 LAB — CBC
HCT: 30.2 — ABNORMAL LOW
Hemoglobin: 10.6 — ABNORMAL LOW
RBC: 3.42 — ABNORMAL LOW
WBC: 6.6

## 2011-02-25 LAB — URINALYSIS, ROUTINE W REFLEX MICROSCOPIC
Glucose, UA: NEGATIVE
Hgb urine dipstick: NEGATIVE
Protein, ur: NEGATIVE
Specific Gravity, Urine: 1.01

## 2011-02-25 LAB — FETAL FIBRONECTIN

## 2011-02-25 LAB — MAGNESIUM: Magnesium: 4.8 — ABNORMAL HIGH

## 2011-02-26 LAB — CBC
Hemoglobin: 11.5 — ABNORMAL LOW
MCHC: 34.3
MCV: 88.2
RBC: 3.79 — ABNORMAL LOW
RDW: 12.6

## 2011-02-26 LAB — URINALYSIS, ROUTINE W REFLEX MICROSCOPIC
Glucose, UA: NEGATIVE
Glucose, UA: NEGATIVE
Hgb urine dipstick: NEGATIVE
Ketones, ur: 40 — AB
Ketones, ur: NEGATIVE
Protein, ur: NEGATIVE
Protein, ur: NEGATIVE
Urobilinogen, UA: 0.2
pH: 6

## 2011-03-01 LAB — CBC
HCT: 31.2 — ABNORMAL LOW
HCT: 32 — ABNORMAL LOW
Hemoglobin: 10.8 — ABNORMAL LOW
MCHC: 35.9
MCV: 86.2
Platelets: 222
RDW: 12.8
RDW: 12.8
WBC: 6.7
WBC: 9

## 2011-03-01 LAB — COMPREHENSIVE METABOLIC PANEL
ALT: 12
BUN: 2 — ABNORMAL LOW
CO2: 24
Calcium: 8.5
Creatinine, Ser: 0.41
GFR calc non Af Amer: >60
Glucose, Bld: 79
Total Protein: 5.6 — ABNORMAL LOW

## 2011-03-01 LAB — RPR: RPR Ser Ql: NONREACTIVE

## 2011-03-01 LAB — LIPASE, BLOOD: Lipase: 16

## 2011-03-16 LAB — URINALYSIS, ROUTINE W REFLEX MICROSCOPIC
Hgb urine dipstick: NEGATIVE
Nitrite: NEGATIVE
Specific Gravity, Urine: 1.02
Urobilinogen, UA: 0.2
pH: 6.5

## 2011-03-16 LAB — CBC
HCT: 31.1 — ABNORMAL LOW
MCV: 90.3
Platelets: 213
RDW: 12.9

## 2011-03-16 LAB — URINE MICROSCOPIC-ADD ON: RBC / HPF: NONE SEEN

## 2011-03-17 LAB — DIFFERENTIAL
Basophils Absolute: 0
Basophils Relative: 0
Eosinophils Absolute: 0
Eosinophils Relative: 1
Lymphocytes Relative: 15
Lymphs Abs: 1
Monocytes Absolute: 0.4
Monocytes Relative: 7
Neutro Abs: 5
Neutrophils Relative %: 78 — ABNORMAL HIGH

## 2011-03-17 LAB — AMYLASE: Amylase: 57

## 2011-03-17 LAB — CBC
MCHC: 35.6
RDW: 13.4

## 2011-03-17 LAB — LIPASE, BLOOD: Lipase: 15

## 2011-03-19 LAB — CBC
HCT: 34.4 — ABNORMAL LOW
Platelets: 220
RDW: 12.9

## 2011-03-19 LAB — URINALYSIS, ROUTINE W REFLEX MICROSCOPIC
Nitrite: NEGATIVE
Protein, ur: NEGATIVE
Urobilinogen, UA: 0.2

## 2011-03-19 LAB — COMPREHENSIVE METABOLIC PANEL
Albumin: 3.3 — ABNORMAL LOW
Alkaline Phosphatase: 46
BUN: 15
CO2: 24
Chloride: 106
Creatinine, Ser: 0.55
GFR calc non Af Amer: >60
Glucose, Bld: 94
Potassium: 3.7
Total Bilirubin: 1.1

## 2011-03-19 LAB — WET PREP, GENITAL
Trich, Wet Prep: NONE SEEN
Yeast Wet Prep HPF POC: NONE SEEN

## 2011-03-19 LAB — AMYLASE: Amylase: 67

## 2011-03-19 LAB — LIPASE, BLOOD: Lipase: 22

## 2011-03-22 LAB — COMPREHENSIVE METABOLIC PANEL
ALT: 19
AST: 20
Albumin: 3.7
Alkaline Phosphatase: 54
GFR calc Af Amer: >60
Glucose, Bld: 91
Potassium: 4.2
Sodium: 135
Total Protein: 6.4

## 2011-03-22 LAB — URINE CULTURE

## 2011-03-22 LAB — URINALYSIS, ROUTINE W REFLEX MICROSCOPIC
Glucose, UA: NEGATIVE
Hgb urine dipstick: NEGATIVE
Ketones, ur: NEGATIVE
Protein, ur: NEGATIVE
pH: 7

## 2011-03-22 LAB — GC/CHLAMYDIA PROBE AMP, GENITAL: Chlamydia, DNA Probe: POSITIVE — AB

## 2011-03-22 LAB — WET PREP, GENITAL
Clue Cells Wet Prep HPF POC: NONE SEEN
Yeast Wet Prep HPF POC: NONE SEEN

## 2011-03-22 LAB — URINE MICROSCOPIC-ADD ON

## 2011-03-22 LAB — CBC
Hemoglobin: 13.3
Hemoglobin: 13.6
RBC: 4.43
RDW: 13.3
WBC: 6.5

## 2011-03-23 LAB — DIFFERENTIAL
Basophils Relative: 0
Eosinophils Absolute: 0.1
Eosinophils Relative: 1
Monocytes Relative: 9
Neutrophils Relative %: 59

## 2011-03-23 LAB — URINE MICROSCOPIC-ADD ON

## 2011-03-23 LAB — COMPREHENSIVE METABOLIC PANEL
ALT: 19
AST: 19
Alkaline Phosphatase: 56
CO2: 22
GFR calc Af Amer: >60
GFR calc non Af Amer: >60
Glucose, Bld: 82
Potassium: 3.7
Sodium: 135

## 2011-03-23 LAB — CBC
Hemoglobin: 13.6
RBC: 4.58
WBC: 6.5

## 2011-03-23 LAB — URINALYSIS, ROUTINE W REFLEX MICROSCOPIC
Bilirubin Urine: NEGATIVE
Glucose, UA: NEGATIVE
Hgb urine dipstick: NEGATIVE
Specific Gravity, Urine: 1.028
Urobilinogen, UA: 0.2

## 2011-03-23 LAB — POCT PREGNANCY, URINE
Operator id: 288831
Preg Test, Ur: NEGATIVE
Preg Test, Ur: POSITIVE

## 2011-06-08 DIAGNOSIS — N809 Endometriosis, unspecified: Secondary | ICD-10-CM

## 2011-06-08 HISTORY — DX: Endometriosis, unspecified: N80.9

## 2011-06-20 ENCOUNTER — Encounter (HOSPITAL_COMMUNITY): Payer: Self-pay | Admitting: *Deleted

## 2011-06-20 ENCOUNTER — Emergency Department (HOSPITAL_COMMUNITY): Payer: Self-pay

## 2011-06-20 ENCOUNTER — Emergency Department (HOSPITAL_COMMUNITY)
Admission: EM | Admit: 2011-06-20 | Discharge: 2011-06-21 | Discharge status: Against Medical Advice | Payer: Self-pay | Attending: Emergency Medicine | Admitting: Emergency Medicine

## 2011-06-20 DIAGNOSIS — R109 Unspecified abdominal pain: Secondary | ICD-10-CM | POA: Insufficient documentation

## 2011-06-20 DIAGNOSIS — R197 Diarrhea, unspecified: Secondary | ICD-10-CM | POA: Insufficient documentation

## 2011-06-20 DIAGNOSIS — R10819 Abdominal tenderness, unspecified site: Secondary | ICD-10-CM | POA: Insufficient documentation

## 2011-06-20 HISTORY — DX: Acute pancreatitis without necrosis or infection, unspecified: K85.90

## 2011-06-20 LAB — URINALYSIS, ROUTINE W REFLEX MICROSCOPIC
Glucose, UA: NEGATIVE mg/dL
Ketones, ur: NEGATIVE mg/dL
Nitrite: POSITIVE — AB
Protein, ur: NEGATIVE mg/dL
Urobilinogen, UA: 0.2 mg/dL (ref 0.0–1.0)

## 2011-06-20 LAB — DIFFERENTIAL
Basophils Absolute: 0 10*3/uL (ref 0.0–0.1)
Lymphocytes Relative: 48 % — ABNORMAL HIGH (ref 12–46)
Neutro Abs: 2.4 10*3/uL (ref 1.7–7.7)

## 2011-06-20 LAB — CBC
Platelets: 226 10*3/uL (ref 150–400)
RDW: 16 % — ABNORMAL HIGH (ref 11.5–15.5)
WBC: 6.1 10*3/uL (ref 4.0–10.5)

## 2011-06-20 LAB — COMPREHENSIVE METABOLIC PANEL
ALT: 13 U/L (ref 0–35)
AST: 17 U/L (ref 0–37)
CO2: 25 mEq/L (ref 19–32)
Calcium: 9.1 mg/dL (ref 8.4–10.5)
Chloride: 105 mEq/L (ref 96–112)
GFR calc non Af Amer: >90 mL/min (ref >90)
Sodium: 139 mEq/L (ref 135–145)

## 2011-06-20 LAB — PREGNANCY, URINE: Preg Test, Ur: NEGATIVE

## 2011-06-20 LAB — URINE MICROSCOPIC-ADD ON

## 2011-06-20 MED ORDER — HYDROMORPHONE HCL PF 1 MG/ML IJ SOLN
1.0000 mg | Freq: Once | INTRAMUSCULAR | Status: AC
  Administered 2011-06-20: 1 mg via INTRAVENOUS
  Filled 2011-06-20: qty 1

## 2011-06-20 MED ORDER — ONDANSETRON HCL 4 MG/2ML IJ SOLN
4.0000 mg | Freq: Once | INTRAMUSCULAR | Status: AC
  Administered 2011-06-20: 4 mg via INTRAVENOUS
  Filled 2011-06-20: qty 2

## 2011-06-20 NOTE — ED Notes (Signed)
Pt resting and appears much more comfortable. NAD noted at this time.

## 2011-06-20 NOTE — ED Notes (Signed)
abd pain for one week with nv.  She says she has pancreatitis from her gb being removed in 2004.  Hyperventilating she says she feels lightheaded
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
## 2011-06-20 NOTE — ED Notes (Addendum)
Pt returned from CT °

## 2011-06-20 NOTE — ED Notes (Signed)
Patient transported to CT 

## 2011-06-20 NOTE — ED Provider Notes (Signed)
History     CSN: [id number]  Arrival date & time 06/20/11  1939   First MD Initiated Contact with Patient 06/20/11 2012      Chief Complaint  Patient presents with  . Abdominal Cramping    (Consider location/radiation/quality/duration/timing/severity/associated sxs/prior treatment) HPI History provided by pt.   Pt has had intermittent, waxing and waning pain in right flank x 1 week.  Pain is severe today and radiates to upper abdomen.  Seems to be aggravated by eating.  Associated w/ nausea and diarrhea.  Denies GU sx.   Has never had a kidney stone.  Has h/o pancreatitis and this pain feels similar.  Past abd surgeries include cholecystectomy in 2004.    Past Medical History  Diagnosis Date  . Pancreatitis     Past Surgical History  Procedure Date  . Cholecystectomy     History reviewed. No pertinent family history.  History  Substance Use Topics  . Smoking status: Never Smoker   . Smokeless tobacco: Not on file  . Alcohol Use: No    OB History    Grav Para Term Preterm Abortions TAB SAB Ect Mult Living                  Review of Systems  All other systems reviewed and are negative.    Allergies  Review of patient's allergies indicates no known allergies.  Home Medications   Current Outpatient Rx  Name Route Sig Dispense Refill  . IBUPROFEN 200 MG PO TABS Oral Take 400 mg by mouth every 6 (six) hours as needed. For pain      BP 130/85  Pulse 81  Temp(Src) 98.1 F (36.7 C) (Oral)  Resp 18  SpO2 100%  LMP 06/14/2011  Physical Exam  Nursing note and vitals reviewed. Constitutional: She is oriented to person, place, and time. She appears well-developed and well-nourished. No distress.       Pt appears very uncomfortable.  Unable to sit still.  Writhing in pain.    HENT:  Head: Normocephalic and atraumatic.  Eyes:       Normal appearance  Neck: Normal range of motion.  Cardiovascular: Normal rate and regular rhythm.   Pulmonary/Chest: Effort  normal and breath sounds normal.  Abdominal: Soft. Bowel sounds are normal. She exhibits no distension and no mass. There is no tenderness. There is no rebound and no guarding.       Bilateral CVA ttp; worse on R.  Mild-mod ttp of entire upper abd, especially epigastrium.   Neurological: She is alert and oriented to person, place, and time.  Skin: Skin is warm and dry. No rash noted.  Psychiatric: She has a normal mood and affect. Her behavior is normal.    ED Course  Procedures (including critical care time)  Labs Reviewed  URINALYSIS, ROUTINE W REFLEX MICROSCOPIC - Abnormal; Notable for the following:    APPearance CLOUDY (*)    Nitrite POSITIVE (*)    Leukocytes, UA SMALL (*)    All other components within normal limits  CBC - Abnormal; Notable for the following:    Hemoglobin 10.7 (*)    HCT 33.4 (*)    MCH 25.4 (*)    RDW 16.0 (*)    All other components within normal limits  DIFFERENTIAL - Abnormal; Notable for the following:    Neutrophils Relative 39 (*)    Lymphocytes Relative 48 (*)    All other components within normal limits  URINE MICROSCOPIC-ADD ON - Abnormal;  Notable for the following:    Squamous Epithelial / LPF FEW (*)    Bacteria, UA FEW (*)    All other components within normal limits  PREGNANCY, URINE  COMPREHENSIVE METABOLIC PANEL  LIPASE, BLOOD  URINE CULTURE   Ct Abdomen Pelvis Wo Contrast  06/20/2011  *RADIOLOGY REPORT*  Clinical Data: Worsening right flank pain.  CT ABDOMEN AND PELVIS WITHOUT CONTRAST  Technique:  Multidetector CT imaging of the abdomen and pelvis was performed following the standard protocol without intravenous contrast.  Comparison: CT of the abdomen and pelvis performed 06/12/2010  Findings: The visualized lung bases are clear.  The liver and spleen are unremarkable in appearance.  The patient is status post cholecystectomy, with clips noted at the gallbladder fossa.  The pancreas and adrenal glands are unremarkable.  Bilateral fetal  lobulations are noted.  The kidneys are otherwise unremarkable in appearance.  There is no evidence of hydronephrosis.  No renal or ureteral stones are seen.  No perinephric stranding is appreciated.  No free fluid is identified.  The small bowel is unremarkable in appearance.  The stomach is within normal limits.  No acute vascular abnormalities are seen.  The appendix is normal in caliber and contains air, without evidence for appendicitis.  The colon is unremarkable in appearance.  The bladder is mildly distended and grossly unremarkable in appearance.  The uterus is within normal limits.  The ovaries are relatively symmetric; no suspicious adnexal masses are seen.  No inguinal lymphadenopathy is seen.  No acute osseous abnormalities are identified.  IMPRESSION: No acute abnormalities seen within the abdomen or pelvis.  The patient has had eight CTs of the abdomen and pelvis in the past 5 years; would avoid additional ionizing radiation as deemed clinically feasible.  Original Report Authenticated By: Tonia Ghent, M.D.     1. Flank pain       MDM  Pt presents w/ c/o severe flank pain w/ radiation to abd and associated nausea and diarrhea.  H/o cholecystectomy.  Pt appears very uncomfortable on exam.  Writhing, restless and tearful.  Right CVA and mild, diffuse upper abd ttp.  Pt received 2 separate doses of dilaudid and pain improved after the second. Labs unremarkable.  CT abd/pelvis neg for kidney stone or any other acute pathology.  Results discussed w/ pt.  I explained that we were going to try a GI cocktail for possible gastritis and then d/c home w/ pain medication and referral back to her PCP.  Pt seemed agreeable to plan but eloped with her IV in place a few minutes later.     Medical screening examination/treatment/procedure(s) were performed by non-physician practitioner and as supervising physician I was immediately available for consultation/collaboration. Osvaldo Human,  M.D.  Arie Sabina Bruno, PA 06/21/11 0115  Carleene Cooper III, MD 06/22/11 [phone]

## 2011-06-21 MED ORDER — GI COCKTAIL ~~LOC~~
30.0000 mL | Freq: Once | ORAL | Status: DC
  Filled 2011-06-21: qty 30
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
## 2011-06-29 ENCOUNTER — Emergency Department (HOSPITAL_COMMUNITY)
Admission: EM | Admit: 2011-06-29 | Discharge: 2011-06-29 | Disposition: A | Discharge status: Home / Self Care | Payer: No Typology Code available for payment source | Attending: Emergency Medicine | Admitting: Emergency Medicine

## 2011-06-29 ENCOUNTER — Emergency Department (HOSPITAL_COMMUNITY): Payer: No Typology Code available for payment source

## 2011-06-29 ENCOUNTER — Encounter (HOSPITAL_COMMUNITY): Payer: Self-pay | Admitting: Emergency Medicine

## 2011-06-29 DIAGNOSIS — M549 Dorsalgia, unspecified: Secondary | ICD-10-CM | POA: Insufficient documentation

## 2011-06-29 DIAGNOSIS — T1490XA Injury, unspecified, initial encounter: Secondary | ICD-10-CM | POA: Insufficient documentation

## 2011-06-29 DIAGNOSIS — M542 Cervicalgia: Secondary | ICD-10-CM | POA: Insufficient documentation

## 2011-06-29 DIAGNOSIS — R079 Chest pain, unspecified: Secondary | ICD-10-CM | POA: Insufficient documentation

## 2011-06-29 MED ORDER — CYCLOBENZAPRINE HCL 10 MG PO TABS: 10.0000 mg | ORAL_TABLET | Freq: Two times a day (BID) | ORAL | Status: AC | PRN

## 2011-06-29 MED ORDER — HYDROCODONE-ACETAMINOPHEN 5-325 MG PO TABS: 2.0000 | ORAL_TABLET | ORAL | Status: AC | PRN

## 2011-06-29 MED ORDER — IBUPROFEN 600 MG PO TABS: 600.0000 mg | ORAL_TABLET | Freq: Four times a day (QID) | ORAL | Status: AC | PRN
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
## 2011-07-01 NOTE — ED Provider Notes (Signed)
Medical screening examination/treatment/procedure(s) were performed by non-physician practitioner and as supervising physician I was immediately available for consultation/collaboration.  Jayel Inks, MD 07/01/11 0258 

## 2011-10-05 ENCOUNTER — Emergency Department (HOSPITAL_COMMUNITY)
Admission: EM | Admit: 2011-10-05 | Discharge: 2011-10-05 | Disposition: A | Discharge status: Home / Self Care | Payer: Self-pay | Attending: Emergency Medicine | Admitting: Emergency Medicine

## 2011-10-05 ENCOUNTER — Encounter (HOSPITAL_COMMUNITY): Payer: Self-pay | Admitting: Emergency Medicine

## 2011-10-05 DIAGNOSIS — N760 Acute vaginitis: Secondary | ICD-10-CM | POA: Insufficient documentation

## 2011-10-05 DIAGNOSIS — A499 Bacterial infection, unspecified: Secondary | ICD-10-CM | POA: Insufficient documentation

## 2011-10-05 DIAGNOSIS — B9689 Other specified bacterial agents as the cause of diseases classified elsewhere: Secondary | ICD-10-CM | POA: Insufficient documentation

## 2011-10-05 DIAGNOSIS — N390 Urinary tract infection, site not specified: Secondary | ICD-10-CM | POA: Insufficient documentation

## 2011-10-05 LAB — WET PREP, GENITAL: Trich, Wet Prep: NONE SEEN

## 2011-10-05 LAB — URINALYSIS, ROUTINE W REFLEX MICROSCOPIC
Glucose, UA: NEGATIVE mg/dL
Nitrite: POSITIVE — AB
Protein, ur: NEGATIVE mg/dL
Urobilinogen, UA: 0.2 mg/dL (ref 0.0–1.0)

## 2011-10-05 LAB — URINE MICROSCOPIC-ADD ON

## 2011-10-05 MED ORDER — CEPHALEXIN 250 MG PO CAPS: 250.0000 mg | ORAL_CAPSULE | Freq: Three times a day (TID) | ORAL | Status: AC

## 2011-10-05 MED ORDER — METRONIDAZOLE 500 MG PO TABS: 500.0000 mg | ORAL_TABLET | Freq: Two times a day (BID) | ORAL | Status: AC

## 2011-10-05 MED ORDER — METRONIDAZOLE 500 MG PO TABS
500.0000 mg | ORAL_TABLET | Freq: Once | ORAL | Status: AC
  Administered 2011-10-05: 500 mg via ORAL
  Filled 2011-10-05: qty 1

## 2011-10-05 MED ORDER — CEPHALEXIN 250 MG PO CAPS
250.0000 mg | ORAL_CAPSULE | Freq: Once | ORAL | Status: AC
  Administered 2011-10-05: 250 mg via ORAL
  Filled 2011-10-05: qty 1

## 2011-10-05 NOTE — ED Notes (Signed)
Onset 3 days ago stated blood in urine and vaginal discharge.  States pain at rest 8/10 pressure tightness feel vaginal area.

## 2011-10-05 NOTE — Discharge Instructions (Signed)
Bacterial Vaginosis Bacterial vaginosis (BV) is a vaginal infection where the normal balance of bacteria in the vagina is disrupted. The normal balance is then replaced by an overgrowth of certain bacteria. There are several different kinds of bacteria that can cause BV. BV is the most common vaginal infection in women of childbearing age. CAUSES   The cause of BV is not fully understood. BV develops when there is an increase or imbalance of harmful bacteria.   Some activities or behaviors can upset the normal balance of bacteria in the vagina and put women at increased risk including:   Having a new sex partner or multiple sex partners.   Douching.   Using an intrauterine device (IUD) for contraception.   It is not clear what role sexual activity plays in the development of BV. However, women that have never had sexual intercourse are rarely infected with BV.  Women do not get BV from toilet seats, bedding, swimming pools or from touching objects around them.  SYMPTOMS   Grey vaginal discharge.   A fish-like odor with discharge, especially after sexual intercourse.   Itching or burning of the vagina and vulva.   Burning or pain with urination.   Some women have no signs or symptoms at all.  DIAGNOSIS  Your caregiver must examine the vagina for signs of BV. Your caregiver will perform lab tests and look at the sample of vaginal fluid through a microscope. They will look for bacteria and abnormal cells (clue cells), a pH test higher than 4.5, and a positive amine test all associated with BV.  RISKS AND COMPLICATIONS   Pelvic inflammatory disease (PID).   Infections following gynecology surgery.   Developing HIV.   Developing herpes virus.  TREATMENT  Sometimes BV will clear up without treatment. However, all women with symptoms of BV should be treated to avoid complications, especially if gynecology surgery is planned. Female partners generally do not need to be treated. However,  BV may spread between female sex partners so treatment is helpful in preventing a recurrence of BV.   BV may be treated with antibiotics. The antibiotics come in either pill or vaginal cream forms. Either can be used with nonpregnant or pregnant women, but the recommended dosages differ. These antibiotics are not harmful to the baby.   BV can recur after treatment. If this happens, a second round of antibiotics will often be prescribed.   Treatment is important for pregnant women. If not treated, BV can cause a premature delivery, especially for a pregnant woman who had a premature birth in the past. All pregnant women who have symptoms of BV should be checked and treated.   For chronic reoccurrence of BV, treatment with a type of prescribed gel vaginally twice a week is helpful.  HOME CARE INSTRUCTIONS   Finish all medication as directed by your caregiver.   Do not have sex until treatment is completed.   Tell your sexual partner that you have a vaginal infection. They should see their caregiver and be treated if they have problems, such as a mild rash or itching.   Practice safe sex. Use condoms. Only have 1 sex partner.  PREVENTION  Basic prevention steps can help reduce the risk of upsetting the natural balance of bacteria in the vagina and developing BV:  Do not have sexual intercourse (be abstinent).   Do not douche.   Use all of the medicine prescribed for treatment of BV, even if the signs and symptoms go away.     Tell your sex partner if you have BV. That way, they can be treated, if needed, to prevent reoccurrence.  SEEK MEDICAL CARE IF:   Your symptoms are not improving after 3 days of treatment.   You have increased discharge, pain, or fever.  MAKE SURE YOU:   Understand these instructions.   Will watch your condition.   Will get help right away if you are not doing well or get worse.  FOR MORE INFORMATION  Division of STD Prevention (DSTDP), Centers for Disease  Control and Prevention: www.cdc.gov/std American Social Health Association (ASHA): www.ashastd.org  Document Released: 05/24/2005 Document Revised: 05/13/2011 Document Reviewed: 11/14/2008 Exit[employer] Patient Information 2012 [employer]. 

## 2011-10-05 NOTE — ED Provider Notes (Addendum)
History     CSN: [id number]  Arrival date & time 10/05/11  1826   None     Chief Complaint  Patient presents with  . Vaginal Discharge    (Consider location/radiation/quality/duration/timing/severity/associated sxs/prior treatment) HPI Comments: Patient had a vaginal discharge for several, days.  She's been trying to some bleeding and a feeling of swelling in the vaginal area  Patient is a 31 y.o. female presenting with vaginal discharge. The history is provided by the patient.  Vaginal Discharge This is a new problem. The current episode started in the past 7 days. The problem occurs constantly. The problem has been unchanged. Pertinent negatives include no abdominal pain or fever.    Past Medical History  Diagnosis Date  . Pancreatitis     Past Surgical History  Procedure Date  . Cholecystectomy   . Tubal ligation     Family History  Problem Relation Age of Onset  . Hypertension Father   . Diabetes Father     History  Substance Use Topics  . Smoking status: Never Smoker   . Smokeless tobacco: Not on file  . Alcohol Use: No    OB History    Grav Para Term Preterm Abortions TAB SAB Ect Mult Living                  Review of Systems  Constitutional: Negative for fever.  Gastrointestinal: Negative for abdominal pain.  Genitourinary: Positive for vaginal bleeding, vaginal discharge and vaginal pain. Negative for dysuria and frequency.  Neurological: Negative for dizziness.    Allergies  Morphine and related  Home Medications   Current Outpatient Rx  Name Route Sig Dispense Refill  . BV TREATMENT VA Vaginal Place vaginally.    . IBUPROFEN 200 MG PO TABS Oral Take 400 mg by mouth every 8 (eight) hours as needed. For pain      BP 133/78  Pulse 90  Temp(Src) 98 F (36.7 C) (Oral)  Resp 18  SpO2 100%  Physical Exam  Constitutional: She is oriented to person, place, and time. She appears well-developed.  HENT:  Head: Normocephalic.  Eyes: Pupils  are equal, round, and reactive to light.  Abdominal: Soft. She exhibits no distension. There is no tenderness.  Genitourinary: Vaginal discharge found.  Neurological: She is alert and oriented to person, place, and time.    ED Course  Procedures (including critical care time)  Labs Reviewed  URINALYSIS, ROUTINE W REFLEX MICROSCOPIC - Abnormal; Notable for the following:    APPearance HAZY (*)    Nitrite POSITIVE (*)    Leukocytes, UA MODERATE (*)    All other components within normal limits  URINE MICROSCOPIC-ADD ON - Abnormal; Notable for the following:    Squamous Epithelial / LPF MANY (*)    Bacteria, UA MANY (*)    All other components within normal limits  WET PREP, GENITAL - Abnormal; Notable for the following:    Clue Cells Wet Prep HPF POC FEW (*)    WBC, Wet Prep HPF POC MANY (*)    All other components within normal limits  GC/CHLAMYDIA PROBE AMP, GENITAL   No results found.   1. Bacterial vaginal infection   2. UTI (lower urinary tract infection)       MDM  Vaginal discharge, after the use of over-the-counter Monistat.  Urine is contaminated, but positive for nitrates, are most likely urinary tract infection, as well as bacterial vaginosis        Arman Filter,  NP 10/05/11 2205  Arman Filter, NP 11/19/11 1605

## 2011-10-05 NOTE — ED Notes (Signed)
PELVIC EXAM PERFORMED BY NP WITH LADY NT CHAPERONE .

## 2011-10-05 NOTE — ED Provider Notes (Signed)
Medical screening examination/treatment/procedure(s) were performed by non-physician practitioner and as supervising physician I was immediately available for consultation/collaboration.   Dayton Bailiff, MD 10/05/11 2303

## 2011-10-05 NOTE — ED Notes (Signed)
Pelvic cart set up at bedside  

## 2012-01-13 ENCOUNTER — Emergency Department (HOSPITAL_COMMUNITY)
Admission: EM | Admit: 2012-01-13 | Discharge: 2012-01-14 | Discharge status: Against Medical Advice | Payer: Self-pay | Attending: Emergency Medicine | Admitting: Emergency Medicine

## 2012-01-13 ENCOUNTER — Encounter (HOSPITAL_COMMUNITY): Payer: Self-pay | Admitting: Adult Health

## 2012-01-13 DIAGNOSIS — R109 Unspecified abdominal pain: Secondary | ICD-10-CM | POA: Insufficient documentation

## 2012-01-13 LAB — URINE MICROSCOPIC-ADD ON

## 2012-01-13 LAB — CBC WITH DIFFERENTIAL/PLATELET
Basophils Absolute: 0 10*3/uL (ref 0.0–0.1)
Lymphocytes Relative: 36 % (ref 12–46)
Lymphs Abs: 2.6 10*3/uL (ref 0.7–4.0)
Neutro Abs: 3.6 10*3/uL (ref 1.7–7.7)
Neutrophils Relative %: 50 % (ref 43–77)
Platelets: 275 10*3/uL (ref 150–400)
RBC: 4.07 MIL/uL (ref 3.87–5.11)
RDW: 14.9 % (ref 11.5–15.5)
WBC: 7.2 10*3/uL (ref 4.0–10.5)

## 2012-01-13 LAB — URINALYSIS, ROUTINE W REFLEX MICROSCOPIC
Bilirubin Urine: NEGATIVE
Glucose, UA: NEGATIVE mg/dL
Hgb urine dipstick: NEGATIVE
Protein, ur: NEGATIVE mg/dL
Specific Gravity, Urine: 1.028 (ref 1.005–1.030)
Urobilinogen, UA: 0.2 mg/dL (ref 0.0–1.0)

## 2012-01-13 LAB — COMPREHENSIVE METABOLIC PANEL
ALT: 15 U/L (ref 0–35)
AST: 17 U/L (ref 0–37)
Alkaline Phosphatase: 64 U/L (ref 39–117)
CO2: 24 mEq/L (ref 19–32)
GFR calc Af Amer: >90 mL/min (ref >90)
GFR calc non Af Amer: >90 mL/min (ref >90)
Glucose, Bld: 107 mg/dL — ABNORMAL HIGH (ref 70–99)
Potassium: 4.3 mEq/L (ref 3.5–5.1)
Sodium: 139 mEq/L (ref 135–145)

## 2012-01-13 LAB — POCT PREGNANCY, URINE: Preg Test, Ur: NEGATIVE

## 2012-01-13 MED ORDER — GI COCKTAIL ~~LOC~~
30.0000 mL | Freq: Once | ORAL | Status: AC
  Administered 2012-01-13: 30 mL via ORAL
  Filled 2012-01-13: qty 30

## 2012-01-13 MED ORDER — ONDANSETRON HCL 4 MG/2ML IJ SOLN
4.0000 mg | Freq: Once | INTRAMUSCULAR | Status: AC
  Administered 2012-01-13: 4 mg via INTRAVENOUS
  Filled 2012-01-13: qty 2

## 2012-01-13 MED ORDER — DICYCLOMINE HCL 10 MG/ML IM SOLN
20.0000 mg | Freq: Once | INTRAMUSCULAR | Status: AC
  Administered 2012-01-13: 20 mg via INTRAMUSCULAR
  Filled 2012-01-13: qty 2

## 2012-01-13 NOTE — ED Provider Notes (Signed)
History     CSN: [id number]  Arrival date & time 01/13/12  2106   First MD Initiated Contact with Patient 01/13/12 2259      Chief Complaint  Patient presents with  . Abdominal Cramping    (Consider location/radiation/quality/duration/timing/severity/associated sxs/prior treatment) HPI 31 year old female presents to emergency room complaining of abdominal pain. Patient with acute onset of diffuse abdominal pain immediately after eating. Patient reports past history of pancreatitis, cause unknown. She has had cholecystectomy done 2004. Patient reports ongoing "stomach problems" she has not been worked up for this. She reports often having abdominal cramping and pain. Patient took some Tylenol which has not helped her pain. She has had nausea and vomiting. She denies any fever. She has had one week of vaginal discharge. She denies any urinary symptoms. No sick contacts, no other complaints.  Past Medical History  Diagnosis Date  . Pancreatitis     Past Surgical History  Procedure Date  . Cholecystectomy   . Tubal ligation     Family History  Problem Relation Age of Onset  . Hypertension Father   . Diabetes Father     History  Substance Use Topics  . Smoking status: Never Smoker   . Smokeless tobacco: Not on file  . Alcohol Use: No    OB History    Grav Para Term Preterm Abortions TAB SAB Ect Mult Living                  Review of Systems  All other systems reviewed and are negative.    Allergies  Morphine and related  Home Medications  No current outpatient prescriptions on file.  BP 112/56  Pulse 92  Temp 98.6 F (37 C) (Oral)  Resp 18  SpO2 100%  Physical Exam  Nursing note and vitals reviewed. Constitutional: She is oriented to person, place, and time. She appears well-developed and well-nourished. She appears distressed (, uncomfortable appearing).  HENT:  Head: Normocephalic and atraumatic.  Nose: Nose normal.  Mouth/Throat: Oropharynx is  clear and moist.  Eyes: Conjunctivae and EOM are normal. Pupils are equal, round, and reactive to light.  Neck: Normal range of motion. Neck supple. No JVD present. No tracheal deviation present. No thyromegaly present.  Cardiovascular: Normal rate, regular rhythm, normal heart sounds and intact distal pulses.  Exam reveals no gallop and no friction rub.   No murmur heard. Pulmonary/Chest: Effort normal and breath sounds normal. No stridor. No respiratory distress. She has no wheezes. She has no rales. She exhibits no tenderness.  Abdominal: Soft. Bowel sounds are normal. She exhibits no distension and no mass. There is tenderness (diffuse tenderness slightly worse suprapubic region). There is no rebound and no guarding.  Musculoskeletal: Normal range of motion. She exhibits no edema and no tenderness.  Lymphadenopathy:    She has no cervical adenopathy.  Neurological: She is alert and oriented to person, place, and time. She exhibits normal muscle tone. Coordination normal.  Skin: Skin is warm and dry. No rash noted. No erythema. No pallor.  Psychiatric: She has a normal mood and affect. Her behavior is normal. Judgment and thought content normal.    ED Course  Procedures (including critical care time)  Labs Reviewed  CBC WITH DIFFERENTIAL - Abnormal; Notable for the following:    Hemoglobin 10.4 (*)     HCT 32.4 (*)     MCH 25.6 (*)     All other components within normal limits  COMPREHENSIVE METABOLIC PANEL - Abnormal; Notable  for the following:    Glucose, Bld 107 (*)     All other components within normal limits  URINALYSIS, ROUTINE W REFLEX MICROSCOPIC - Abnormal; Notable for the following:    APPearance CLOUDY (*)     Nitrite POSITIVE (*)     Leukocytes, UA SMALL (*)     All other components within normal limits  URINE MICROSCOPIC-ADD ON - Abnormal; Notable for the following:    Bacteria, UA FEW (*)     All other components within normal limits  LIPASE, BLOOD  POCT  PREGNANCY, URINE  GC/CHLAMYDIA PROBE AMP, GENITAL  WET PREP, GENITAL   No results found.   1. Abdominal pain       MDM  31 year old female with diffuse abdominal pain. We'll plan to do pelvic exam given her complaint of vaginal discharge. Exam is nonfocal. Labs unremarkable. Treat with Zofran and Bentyl and reassess.    Pt left after receiving medications, did not inform nurse or myself that she was leaving.  Olivia Mackie, MD 01/14/12 [phone]

## 2012-01-13 NOTE — ED Notes (Addendum)
Reports one hour of abdominal cramping that begins in upper quadrants and radiates to mid and lower back. Pain is described as cramping and tightness associated with nausea. Abdomen is tender to touch. Last BM today normal for her. Has had tubal ligation and cholecystectomy.  Hx of pancreatitis

## 2012-01-14 NOTE — ED Notes (Signed)
PT. REMOVED HER PERIPHERAL IV AND ADVISED NURSE THAT SHE WILL LEAVE , STATES " I HAVE KIDS AT HOME".  EDP NOTIFIED.

## 2012-02-28 ENCOUNTER — Other Ambulatory Visit: Payer: Self-pay

## 2012-02-28 ENCOUNTER — Encounter (HOSPITAL_COMMUNITY): Payer: Self-pay | Admitting: *Deleted

## 2012-02-28 ENCOUNTER — Inpatient Hospital Stay (HOSPITAL_COMMUNITY): Payer: Self-pay

## 2012-02-28 ENCOUNTER — Inpatient Hospital Stay (HOSPITAL_COMMUNITY)
Admission: AD | Admit: 2012-02-28 | Discharge: 2012-02-28 | Disposition: A | Discharge status: Home / Self Care | Payer: Self-pay | Source: Ambulatory Visit | Attending: Obstetrics & Gynecology | Admitting: Obstetrics & Gynecology

## 2012-02-28 DIAGNOSIS — N644 Mastodynia: Secondary | ICD-10-CM | POA: Insufficient documentation

## 2012-02-28 DIAGNOSIS — N939 Abnormal uterine and vaginal bleeding, unspecified: Secondary | ICD-10-CM

## 2012-02-28 DIAGNOSIS — D509 Iron deficiency anemia, unspecified: Secondary | ICD-10-CM | POA: Insufficient documentation

## 2012-02-28 DIAGNOSIS — N920 Excessive and frequent menstruation with regular cycle: Secondary | ICD-10-CM | POA: Insufficient documentation

## 2012-02-28 DIAGNOSIS — D649 Anemia, unspecified: Secondary | ICD-10-CM

## 2012-02-28 LAB — CBC
MCH: 23.4 pg — ABNORMAL LOW (ref 26.0–34.0)
MCHC: 29.9 g/dL — ABNORMAL LOW (ref 30.0–36.0)
RDW: 14.2 % (ref 11.5–15.5)

## 2012-02-28 LAB — WET PREP, GENITAL
Clue Cells Wet Prep HPF POC: NONE SEEN
Trich, Wet Prep: NONE SEEN
Yeast Wet Prep HPF POC: NONE SEEN

## 2012-02-28 MED ORDER — FERROUS SULFATE 325 (65 FE) MG PO TABS: 325.0000 mg | ORAL_TABLET | Freq: Every day | ORAL | Status: DC

## 2012-02-28 MED ORDER — DOCUSATE SODIUM 100 MG PO CAPS: 100.0000 mg | ORAL_CAPSULE | ORAL | Status: DC

## 2012-02-28 NOTE — MAU Note (Signed)
Her mother has been dx with breast cancer.  She and her mom have always had breast issues.  Recently her right nipple is starting to invert and has had whitish d/c from nipple.  Has felt a mass in rt breast past few months.  Nipple pain at times.  Has been having 'periods' every 2 wks and passing large clots the past 2 months.   Has been having pain in mid back past couple months.  Past 2 days legs twitch and when it twitchs she has pain in her legs.

## 2012-02-28 NOTE — MAU Provider Note (Signed)
Medical Screening exam and patient care preformed by advanced practice provider.  Agree with the above management.  

## 2012-02-28 NOTE — MAU Provider Note (Signed)
History     CSN: [id number]  Arrival date and time: 02/28/12 1226   None     Chief Complaint  Patient presents with  . Breast Pain  . Breast Discharge  . Metrorrhagia    HPI  Phyllis Wilkins y.o. [id number]. She presents today with complaints of right breast pain and abnormal heavy menstrual bleeding.  Breast pain- Patient reports that she has had a right breast mass for about 3-4 months that is painful. She also reports that her nipple has began to invert and she is having "milky white" discharge. She is concerned about this because her mother who is S/P hysterectomy was recently diagnosed with breast cancer 2 weeks ago.  Abnormal bleeding- Patient report abnormal heavy menstrual bleeding with "huge clots". She reports having periods that are very painful every other week that last 5-6 days. She is not taking any birth control medication; she has h/o tubal ligation. She reports no vaginal discharge.   Past Medical History  Diagnosis Date  . Pancreatitis     Past Surgical History  Procedure Date  . Cholecystectomy   . Tubal ligation     Family History  Problem Relation Age of Onset  . Hypertension Father   . Diabetes Father     History  Substance Use Topics  . Smoking status: Never Smoker   . Smokeless tobacco: Not on file  . Alcohol Use: No    Allergies:  Allergies  Allergen Reactions  . Morphine And Related Anaphylaxis    No prescriptions prior to admission    Review of Systems  Constitutional: Negative.   Respiratory: Negative for shortness of breath.   Cardiovascular: Negative for chest pain.  Gastrointestinal: Positive for abdominal pain.  Genitourinary: Negative for dysuria and urgency.       No vaginal discharge.  Neg for vaginal bleeding today  Skin:       + for breast mass/pain X 3 months.  + white nipple discharge on right  + nipple inverted on left   Physical Exam   Blood pressure 119/83, pulse 76, temperature 98.1 F (36.7 C), temperature  source Oral, resp. rate 18, height 5' 7.5" (1.715 m), weight 94.348 kg (208 lb), last menstrual period 02/20/2012.  Physical Exam  Nursing note and vitals reviewed. Constitutional: She is oriented to person, place, and time. She appears well-developed and well-nourished. No distress.  HENT:  Head: Normocephalic.  Cardiovascular: Normal rate.   Respiratory: Effort normal. No respiratory distress. Right breast exhibits no inverted nipple, no mass, no nipple discharge, no skin change and no tenderness. Left breast exhibits no inverted nipple, no mass, no nipple discharge, no skin change and no tenderness. Breasts are symmetrical.  GI: Soft. Bowel sounds are normal. She exhibits no distension and no mass. There is no hepatosplenomegaly. There is tenderness in the left lower quadrant.  Genitourinary: No breast tenderness, discharge or bleeding. Uterus is tender (mildly). Uterus is not enlarged. Cervix exhibits no discharge and no friability. Right adnexum displays no mass, no tenderness and no fullness. Left adnexum displays no mass, no tenderness and no fullness. No bleeding around the vagina. No vaginal discharge found.  Neurological: She is alert and oriented to person, place, and time.  Skin: Skin is warm and dry. She is not diaphoretic.  Psychiatric: She has a normal mood and affect. Her behavior is normal.    MAU Course  Procedures  CBC Pelvic Ultrasound  Results for orders placed during the hospital encounter of 02/28/12 (from the past  24 hour(s))  POCT PREGNANCY, URINE     Status: Normal   Collection Time   02/28/12  1:17 PM      Component Value Range   Preg Test, Ur NEGATIVE  NEGATIVE  CBC     Status: Abnormal   Collection Time   02/28/12  2:31 PM      Component Value Range   WBC 6.0  4.0 - 10.5 K/uL   RBC 3.34 (*) 3.87 - 5.11 MIL/uL   Hemoglobin 7.8 (*) 12.0 - 15.0 g/dL   HCT 16.1 (*) 09.6 - 04.5 %   MCV 78.1  78.0 - 100.0 fL   MCH 23.4 (*) 26.0 - 34.0 pg   MCHC 29.9 (*) 30.0  - 36.0 g/dL   RDW 40.9  81.1 - 91.4 %   Platelets 273  150 - 400 K/uL    HBG on 01/13/12 was 10.4 Clinical Data: Heavy and irregular bleeding and pain.  TRANSABDOMINAL AND TRANSVAGINAL ULTRASOUND OF PELVIS  Technique: Both transabdominal and transvaginal ultrasound  examinations of the pelvis were performed. Transabdominal technique  was performed for global imaging of the pelvis including uterus,  ovaries, adnexal regions, and pelvic cul-de-sac.  It was necessary to proceed with endovaginal exam following the  transabdominal exam to visualize the endometrium.  Comparison: CT stone study of 06/20/2011  Findings:  Uterus: Measures 7.5 x 4.9 x 7.2 cm and is anteverted. A thin  normal limits for echogenicity. No focal uterine mass is  identified.  Endometrium: Endometrial thickness is approximately 5 mm. There is  a focal echogenic and polypoid area in the endometrium, at the  level of the lower uterine body that demonstrates some vascular  flow on color Doppler imaging. This echogenic area measures 1.4 x  1.6 x 0.9 cm and is suspicious for an endometrial polyp.  Right ovary: Normal appearance/no adnexal mass. Measures 3.1 x  3.0 x 2.4 cm.  Left ovary: Normal appearance/no adnexal mass. Measures 3.5 x 2.6  x 4.0 cm.  Other findings: No free fluid  IMPRESSION:  1. Possible endometrial polyp as described above. Sonohysterogram  could be performed to confirm this suspicion, if desired.  2. Normal ovaries.  Original Report Authenticated By: Britta Mccreedy, M.D.     MDM GC/CHL culture to lab 15:44  Discussed patient's PHI, labs and Ultrasound with Dr. Penne Lash.  Instructed to send patient to clinic to have TSH, Ferritin and Prolactin drawn and have her follow up in gyn clinic in 2 weeks Assessment and Plan  A:  Abnormal vaginal bleeding      Anemia--iron deficiency     Breast pain/discharge  P:  Per Dr. Faye Ramsay instructed to go to [facility] now for blood tests==TSH,  Prolactin, and Ferritin     Follow up in clinic in 2 weeks     Rx for Ferrous sulfate and colace given to patient. Phyllis Wilkins,EVE M 02/28/2012, 2:05 PM

## 2012-02-29 ENCOUNTER — Emergency Department (HOSPITAL_COMMUNITY)
Admission: EM | Admit: 2012-02-29 | Discharge: 2012-03-01 | Discharge status: Against Medical Advice | Payer: Self-pay | Attending: Emergency Medicine | Admitting: Emergency Medicine

## 2012-02-29 ENCOUNTER — Inpatient Hospital Stay (HOSPITAL_COMMUNITY)
Admission: AD | Admit: 2012-02-29 | Discharge: 2012-02-29 | Discharge status: Against Medical Advice | Payer: Self-pay | Source: Ambulatory Visit | Attending: Obstetrics and Gynecology | Admitting: Obstetrics and Gynecology

## 2012-02-29 ENCOUNTER — Telehealth: Payer: Self-pay | Admitting: Medical

## 2012-02-29 ENCOUNTER — Encounter (HOSPITAL_COMMUNITY): Payer: Self-pay | Admitting: Emergency Medicine

## 2012-02-29 DIAGNOSIS — R079 Chest pain, unspecified: Secondary | ICD-10-CM | POA: Insufficient documentation

## 2012-02-29 DIAGNOSIS — R0602 Shortness of breath: Secondary | ICD-10-CM | POA: Insufficient documentation

## 2012-02-29 LAB — GC/CHLAMYDIA PROBE AMP, GENITAL
Chlamydia, DNA Probe: NEGATIVE
GC Probe Amp, Genital: NEGATIVE

## 2012-02-29 LAB — TSH: TSH: 1.847 u[IU]/mL (ref 0.350–4.500)

## 2012-02-29 LAB — PROLACTIN: Prolactin: 10.1 ng/mL

## 2012-02-29 NOTE — ED Notes (Signed)
Patient states she was diagnosed with low hemoglobin at [facility] yesterday, and last night she began having chest pain and shortness of breath.  Chest pains worsen with movement and inspiration.

## 2012-02-29 NOTE — MAU Note (Signed)
Patient is not in the lobby when called to triage.  

## 2012-02-29 NOTE — Telephone Encounter (Signed)
Called patient to discuss symptoms. She is feeling very weak and faint and subjectively worse than yesterday. She denies VB at this time. I have instructed her to go back to MAU for further evaluation as there is no one to see her in the clinic today. The patient also asked about lab and [place] results. I discussed these results with her and she voiced understanding. The patient does plan to go to MAU today. I have instructed her that it would be best for her to have someone drive her to the MAU and not drive herself because of the faint, weak feelings she has been having. The patient voiced understanding and did not have any further questions at this time.

## 2012-02-29 NOTE — Telephone Encounter (Signed)
Patient called stating she was seen at [facility] yesterday. She is not pregnant. Found out she has low Hgb. Review of MAU note shows HGb of 7.8 yesterday. Patient states she is not feeling well, feels faint.

## 2012-03-01 NOTE — ED Notes (Signed)
Pt left AMA °

## 2012-03-06 ENCOUNTER — Telehealth: Payer: Self-pay | Admitting: Obstetrics and Gynecology

## 2012-03-06 NOTE — Telephone Encounter (Signed)
Patient called with concern about her newly diagnosed of anemia and now she is having her period. She is concern whether she should come to MAU. She reports to change her pad only once in an hour and with weakness. She denies sob, dizzzy, or faint.  (She was in MAU 9/23 for DUB. Hgb 7.8) Advised patient to continue to take Iron and colace as prescribed from MAU for now. She is to go back to MAU if  her symptoms worsens such [place] increased bleeding- changing pads more than once in one hour, sob, or feeling of faint. Ok per Dr. Debroah Loop. Patient agrees and satisfied.

## 2012-03-07 ENCOUNTER — Telehealth: Payer: Self-pay | Admitting: Medical

## 2012-03-07 ENCOUNTER — Inpatient Hospital Stay (HOSPITAL_COMMUNITY)
Admission: AD | Admit: 2012-03-07 | Discharge: 2012-03-07 | Disposition: A | Discharge status: Home / Self Care | Payer: Self-pay | Source: Ambulatory Visit | Attending: Family Medicine | Admitting: Family Medicine

## 2012-03-07 ENCOUNTER — Encounter (HOSPITAL_COMMUNITY): Payer: Self-pay

## 2012-03-07 DIAGNOSIS — N920 Excessive and frequent menstruation with regular cycle: Secondary | ICD-10-CM | POA: Insufficient documentation

## 2012-03-07 DIAGNOSIS — N946 Dysmenorrhea, unspecified: Secondary | ICD-10-CM | POA: Insufficient documentation

## 2012-03-07 DIAGNOSIS — R102 Pelvic and perineal pain: Secondary | ICD-10-CM

## 2012-03-07 DIAGNOSIS — N949 Unspecified condition associated with female genital organs and menstrual cycle: Secondary | ICD-10-CM | POA: Insufficient documentation

## 2012-03-07 DIAGNOSIS — D649 Anemia, unspecified: Secondary | ICD-10-CM | POA: Insufficient documentation

## 2012-03-07 HISTORY — DX: Endometriosis, unspecified: N80.9

## 2012-03-07 LAB — URINALYSIS, ROUTINE W REFLEX MICROSCOPIC
Bilirubin Urine: NEGATIVE
Glucose, UA: NEGATIVE mg/dL
Protein, ur: NEGATIVE mg/dL
Specific Gravity, Urine: 1.01 (ref 1.005–1.030)
Urobilinogen, UA: 0.2 mg/dL (ref 0.0–1.0)

## 2012-03-07 LAB — CBC
Hemoglobin: 7.9 g/dL — ABNORMAL LOW (ref 12.0–15.0)
MCH: 23.2 pg — ABNORMAL LOW (ref 26.0–34.0)
Platelets: 267 10*3/uL (ref 150–400)
RBC: 3.4 MIL/uL — ABNORMAL LOW (ref 3.87–5.11)
WBC: 4.4 10*3/uL (ref 4.0–10.5)

## 2012-03-07 LAB — POCT PREGNANCY, URINE: Preg Test, Ur: NEGATIVE

## 2012-03-07 LAB — URINE MICROSCOPIC-ADD ON

## 2012-03-07 MED ORDER — KETOROLAC TROMETHAMINE 60 MG/2ML IM SOLN
60.0000 mg | Freq: Once | INTRAMUSCULAR | Status: DC
Start: 2012-03-07 — End: 2012-03-07
  Filled 2012-03-07: qty 2

## 2012-03-07 MED ORDER — OXYCODONE-ACETAMINOPHEN 5-325 MG PO TABS: 2.0000 | ORAL_TABLET | ORAL | Status: DC | PRN

## 2012-03-07 MED ORDER — MEDROXYPROGESTERONE ACETATE 10 MG PO TABS: 10.0000 mg | ORAL_TABLET | Freq: Every day | ORAL | Status: DC

## 2012-03-07 NOTE — Telephone Encounter (Signed)
Called and spoke with patient. She states that her bleeding is much heavier and she is passing clots. The patient states that she is having back and pelvic pain, feels very bad and weak especially in her legs. The patient feels subjectively worse than previously. I told the patient that we cannot see her in clinic today because we do not have a provider here. I offered to make her an appointment for tomorrow for evaluation in the clinic or she could go to MAU today. The patient then stated that when she stands up she is having gushing of blood that is over-flowing what her pad can handle. I then told her I thought it would be in her best interest to go to MAU today for re-evaluation due to the amount of bleeding she is having and her previously low Hgb. The patient asked if she could go to [facility] instead and I recommended that she come here because our staff specializes in these types of issues. The patient voiced understanding and plans to come to MAU for evaluation. I also recommended that she have someone bring her and not drive herself. The patient voiced understanding and had no further questions at this time.

## 2012-03-07 NOTE — MAU Note (Signed)
Patient has history of heavy periods and had called the clinic today which referred her to MAU, having gushes of blood.

## 2012-03-07 NOTE — MAU Provider Note (Signed)
Chart reviewed and agree with management and plan.  

## 2012-03-07 NOTE — MAU Provider Note (Signed)
History     CSN: [id number]  Arrival date and time: 03/07/12 1012   First Provider Initiated Contact with Patient 03/07/12 1104      Chief Complaint  Patient presents with  . Vaginal Bleeding   HPI Phyllis Wilkins is a 31 y.o. female who presents to MAU with vaginal bleeding. The bleeding started yesterday. She describes the bleeding as heavier than a period with clots. Associated symptoms include abdominal cramping and feeling weak and dizzy.  OB History    Grav Para Term Preterm Abortions TAB SAB Ect Mult Living   6 6 1 5      6       Past Medical History  Diagnosis Date  . Pancreatitis   . Endometriosis     Past Surgical History  Procedure Date  . Cholecystectomy   . Tubal ligation     Family History  Problem Relation Age of Onset  . Hypertension Father   . Diabetes Father   . Cancer Mother     History  Substance Use Topics  . Smoking status: Current Every Day Smoker -- 0.5 packs/day    Types: Cigarettes  . Smokeless tobacco: Not on file  . Alcohol Use: Yes     occas.    Allergies:  Allergies  Allergen Reactions  . Morphine And Related Anaphylaxis    Prescriptions prior to admission  Medication Sig Dispense Refill  . docusate sodium (COLACE) 100 MG capsule Take 100 mg by mouth 2 (two) times daily as needed. Constipation      . ferrous sulfate 325 (65 FE) MG tablet Take 325 mg by mouth daily with breakfast. Patient is to take 2 tabs bid        Review of Systems  Constitutional: Negative for fever, chills and weight loss.  HENT: Negative for ear pain, nosebleeds, congestion, sore throat and neck pain.   Eyes: Negative for blurred vision, double vision, photophobia and pain.  Respiratory: Negative for cough, shortness of breath and wheezing.   Cardiovascular: Negative for chest pain, palpitations and leg swelling.  Gastrointestinal: Positive for abdominal pain. Negative for heartburn, nausea, vomiting, diarrhea and constipation.  Genitourinary:  Negative for dysuria, urgency and frequency.       Vaginal bleeding  Musculoskeletal: Positive for back pain. Negative for myalgias.  Skin: Negative for itching and rash.  Neurological: Positive for dizziness and headaches. Negative for sensory change, speech change, seizures and weakness.  Endo/Heme/Allergies: Does not bruise/bleed easily.       Anemia   Psychiatric/Behavioral: Negative for depression. The patient is not nervous/anxious.    Physical Exam   Blood pressure 132/59, pulse 73, temperature 98.4 F (36.9 C), temperature source Oral, resp. rate 18, last menstrual period 02/20/2012.  Physical Exam  Nursing note and vitals reviewed. Constitutional: She is oriented to person, place, and time. She appears well-developed and well-nourished. No distress.  HENT:  Head: Normocephalic and atraumatic.  Eyes: EOM are normal.  Neck: Neck supple.  Cardiovascular: Normal rate.   Respiratory: Effort normal.  GI: Soft. There is no tenderness.  Genitourinary: Vagina normal.  Musculoskeletal: Normal range of motion.  Neurological: She is alert and oriented to person, place, and time.  Skin: Skin is warm and dry.  Psychiatric: She has a normal mood and affect. Her behavior is normal. Judgment and thought content normal.   Results for orders placed during the hospital encounter of 03/07/12 (from the past 24 hour(s))  URINALYSIS, ROUTINE W REFLEX MICROSCOPIC     Status: Abnormal  Collection Time   03/07/12 10:49 AM      Component Value Range   Color, Urine YELLOW  YELLOW   APPearance CLEAR  CLEAR   Specific Gravity, Urine 1.010  1.005 - 1.030   pH 6.0  5.0 - 8.0   Glucose, UA NEGATIVE  NEGATIVE mg/dL   Hgb urine dipstick MODERATE (*) NEGATIVE   Bilirubin Urine NEGATIVE  NEGATIVE   Ketones, ur NEGATIVE  NEGATIVE mg/dL   Protein, ur NEGATIVE  NEGATIVE mg/dL   Urobilinogen, UA 0.2  0.0 - 1.0 mg/dL   Nitrite NEGATIVE  NEGATIVE   Leukocytes, UA NEGATIVE  NEGATIVE  URINE  MICROSCOPIC-ADD ON     Status: Normal   Collection Time   03/07/12 10:49 AM      Component Value Range   Squamous Epithelial / LPF RARE  RARE   RBC / HPF 11-20  <3 RBC/hpf  POCT PREGNANCY, URINE     Status: Normal   Collection Time   03/07/12 11:06 AM      Component Value Range   Preg Test, Ur NEGATIVE  NEGATIVE  CBC     Status: Abnormal   Collection Time   03/07/12 11:45 AM      Component Value Range   WBC 4.4  4.0 - 10.5 K/uL   RBC 3.40 (*) 3.87 - 5.11 MIL/uL   Hemoglobin 7.9 (*) 12.0 - 15.0 g/dL   HCT 16.1 (*) 09.6 - 04.5 %   MCV 76.5 (*) 78.0 - 100.0 fL   MCH 23.2 (*) 26.0 - 34.0 pg   MCHC 30.4  30.0 - 36.0 g/dL   RDW 40.9  81.1 - 91.4 %   Platelets 267  150 - 400 K/uL   MAU Course: Discussed clinical and lab findings with Dr. Shawnie Pons, will start provera and have patient keep her appointment in [facility]YN Clinic  Procedures Assessment: Dysmenorrhea   Pelvic pain   Anemia  Plan:  Toradol (Patient declined)   Keep appointment with GYN Clinic   Start Provera   Pain management, allergic to morphine but has had Vicodin and other pain medication   since then without reaction.   Medication List     As of 03/07/2012 12:51 PM    START taking these medications         medroxyPROGESTERone 10 MG tablet   Commonly known as: PROVERA   Take 1 tablet (10 mg total) by mouth daily.      oxyCODONE-acetaminophen 5-325 MG per tablet   Commonly known as: PERCOCET/ROXICET   Take 2 tablets by mouth every 4 (four) hours as needed for pain.      CONTINUE taking these medications         docusate sodium 100 MG capsule   Commonly known as: COLACE      ferrous sulfate 325 (65 FE) MG tablet      ibuprofen 200 MG tablet   Commonly known as: ADVIL,MOTRIN          Where to get your medications    These are the prescriptions that you need to pick up.   You may get these medications from any pharmacy.         medroxyPROGESTERone 10 MG tablet   oxyCODONE-acetaminophen 5-325 MG per tablet               Hannan Tetzlaff, RN, FNP, BC 03/07/2012, 11:04 AM

## 2012-03-07 NOTE — Telephone Encounter (Signed)
Patient called stating that she has an appointment on 03/13/12 for MAU follow-up. Had low Hgb at that visit. Started period yesterday, is bleeding heavy, not feeling well and having back and pelvic pain.

## 2012-03-13 ENCOUNTER — Encounter: Payer: Self-pay | Admitting: Family Medicine

## 2012-03-13 ENCOUNTER — Ambulatory Visit (INDEPENDENT_AMBULATORY_CARE_PROVIDER_SITE_OTHER): Payer: Self-pay | Admitting: Family Medicine

## 2012-03-13 VITALS — BP 119/73 | HR 72 | Temp 97.7°F | Ht 69.0 in | Wt 211.2 lb

## 2012-03-13 DIAGNOSIS — N644 Mastodynia: Secondary | ICD-10-CM

## 2012-03-13 DIAGNOSIS — N920 Excessive and frequent menstruation with regular cycle: Secondary | ICD-10-CM

## 2012-03-13 DIAGNOSIS — N921 Excessive and frequent menstruation with irregular cycle: Secondary | ICD-10-CM

## 2012-03-13 MED ORDER — MEDROXYPROGESTERONE ACETATE 10 MG PO TABS: 10.0000 mg | ORAL_TABLET | Freq: Every day | ORAL | Status: DC

## 2012-03-13 NOTE — Patient Instructions (Signed)
Call or return to ED for shortness of breath, fainting, weakness, palpitations, chest pain.  Take Provera 10 mg daily.  Hysteroscopy Hysteroscopy is a procedure used for looking inside the womb (uterus). It may be done for many different reasons, including:  To evaluate abnormal bleeding, fibroid (benign, noncancerous) tumors, polyps, scar tissue (adhesions), and possibly cancer of the uterus.  To look for lumps (tumors) and other uterine growths.  To look for causes of why a woman cannot get pregnant (infertility), causes of recurrent loss of pregnancy (miscarriages), or a lost intrauterine device (IUD).  To perform a sterilization by blocking the fallopian tubes from inside the uterus. A hysteroscopy should be done right after a menstrual period to be sure you are not pregnant. LET YOUR CAREGIVER KNOW ABOUT:   Allergies.  Medicines taken, including herbs, eyedrops, over-the-counter medicines, and creams.  Use of steroids (by mouth or creams).  Previous problems with anesthetics or numbing medicines.  History of bleeding or blood problems.  History of blood clots.  Possibility of pregnancy, if this applies.  Previous surgery.  Other health problems. RISKS AND COMPLICATIONS   Putting a hole in the uterus.  Excessive bleeding.  Infection.  Damage to the cervix.  Injury to other organs.  Allergic reaction to medicines.  Too much fluid used in the uterus for the procedure. BEFORE THE PROCEDURE   Do not take aspirin or blood thinners for a week before the procedure, or as directed. It can cause bleeding.  Arrive at least 60 minutes before the procedure or as directed to read and sign the necessary forms.  Arrange for someone to take you home after the procedure.  If you smoke, do not smoke for 2 weeks before the procedure. PROCEDURE   Your caregiver may give you medicine to relax you. He or she may also give you a medicine that numbs the area around the cervix  (local anesthetic) or a medicine that makes you sleep (general anesthesia).  Sometimes, a medicine is placed in the cervix the day before the procedure. This medicine makes the cervix have a larger opening (dilate). This makes it easier for the instrument to be inserted into the uterus.  A small instrument (hysteroscope) is inserted through the vagina into the uterus. This instrument is similar to a pencil-sized telescope with a light.  During the procedure, air or a liquid is put into the uterus, which allows the surgeon to see better.  Sometimes, tissue is gently scraped from inside the uterus. These tissue samples are sent to a specialist who looks at tissue samples (pathologist). The pathologist will give a report to your caregiver. This will help your caregiver decide if further treatment is necessary. The report will also help your caregiver decide on the best treatment if the test comes back abnormal. AFTER THE PROCEDURE   If you had a general anesthetic, you may be groggy for a couple hours after the procedure.  If you had a local anesthetic, you will be advised to rest at the surgical center or caregiver's office until you are stable and feel ready to go home.  You may have some cramping for a couple days.  You may have bleeding, which varies from light spotting for a few days to menstrual-like bleeding for up to 3 to 7 days. This is normal.  Have someone take you home. FINDING OUT THE RESULTS OF YOUR TEST Not all test results are available during your visit. If your test results are not back during  the visit, make an appointment with your caregiver to find out the results. Do not assume everything is normal if you have not heard from your caregiver or the medical facility. It is important for you to follow up on all of your test results. HOME CARE INSTRUCTIONS   Do not drive for 24 hours or as instructed.  Only take over-the-counter or prescription medicines for pain, discomfort,  or fever as directed by your caregiver.  Do not take aspirin. It can cause or aggravate bleeding.  Do not drive or drink alcohol while taking pain medicine.  You may resume your usual diet.  Do not use tampons, douche, or have sexual intercourse for 2 weeks, or as advised by your caregiver.  Rest and sleep for the first 24 to 48 hours.  Take your temperature twice a day for 4 to 5 days. Write it down. Give these temperatures to your caregiver if they are abnormal (above 98.6 F or 37.0 C).  Take medicines your caregiver has ordered as directed.  Follow your caregiver's advice regarding diet, exercise, lifting, driving, and general activities.  Take showers instead of baths for 2 weeks, or as recommended by your caregiver.  If you develop constipation:  Take a mild laxative with the advice of your caregiver.  Eat bran foods.  Drink enough water and fluids to keep your urine clear or pale yellow.  Try to have someone with you or available to you for the first 24 to 48 hours, especially if you had a general anesthetic.  Make sure you and your family understand everything about your operation and recovery.  Follow your caregiver's advice regarding follow-up appointments and Pap smears. SEEK MEDICAL CARE IF:   You feel dizzy or lightheaded.  You feel sick to your stomach (nauseous).  You develop abnormal vaginal discharge.  You develop a rash.  You have an abnormal reaction or allergy to your medicine.  You need stronger pain medicine. SEEK IMMEDIATE MEDICAL CARE IF:   Bleeding is heavier than a normal menstrual period or you have blood clots.  You have an oral temperature above 102 F (38.9 C), not controlled by medicine.  You have increasing cramps or pains not relieved with medicine.  You develop belly (abdominal) pain that does not seem to be related to the same area of earlier cramping and pain.  You pass out.  You develop pain in the tops of your shoulders  (shoulder strap areas).  You develop shortness of breath. MAKE SURE YOU:   Understand these instructions.  Will watch your condition.  Will get help right away if you are not doing well or get worse. Document Released: 08/30/2000 Document Revised: 08/16/2011 Document Reviewed: 12/23/2008 [facility] Patient Information 2013 [place], [place].   Menorrhagia Dysfunctional uterine bleeding is different from a normal menstrual period. When periods are heavy or there is more bleeding than is usual for you, it is called menorrhagia. It may be caused by hormonal imbalance, or physical, metabolic, or other problems. Examination is necessary in order that your caregiver may treat treatable causes. If this is a continuing problem, a D&C may be needed. That means that the cervix (the opening of the uterus or womb) is dilated (stretched larger) and the lining of the uterus is scraped out. The tissue scraped out is then examined under a microscope by a specialist (pathologist) to make sure there is nothing of concern that needs further or more extensive treatment. HOME CARE INSTRUCTIONS   If medications were prescribed,  take exactly as directed. Do not change or switch medications without consulting your caregiver.  Long term heavy bleeding may result in iron deficiency. Your caregiver may have prescribed iron pills. They help replace the iron your body lost from heavy bleeding. Take exactly as directed. Iron may cause constipation. If this becomes a problem, increase the bran, fruits, and roughage in your diet.  Do not take aspirin or medicines that contain aspirin one week before or during your menstrual period. Aspirin may make the bleeding worse.  If you need to change your sanitary pad or tampon more than once every 2 hours, stay in bed and rest as much as possible until the bleeding stops.  Eat well-balanced meals. Eat foods high in iron. Examples are leafy green vegetables, meat, liver, eggs, and  whole grain breads and cereals. Do not try to lose weight until the abnormal bleeding has stopped and your blood iron level is back to normal. SEEK MEDICAL CARE IF:   You need to change your sanitary pad or tampon more than once an hour.  You develop nausea (feeling sick to your stomach) and vomiting, dizziness, or diarrhea while you are taking your medicine.  You have any problems that may be related to the medicine you are taking. SEEK IMMEDIATE MEDICAL CARE IF:   You have a fever.  You develop chills.  You develop severe bleeding or start to pass blood clots.  You feel dizzy or faint. MAKE SURE YOU:   Understand these instructions.  Will watch your condition.  Will get help right away if you are not doing well or get worse. Document Released: 05/24/2005 Document Revised: 08/16/2011 Document Reviewed: 01/12/2008 [facility] Patient Information 2013 [place], [place].

## 2012-03-13 NOTE — Progress Notes (Signed)
Subjective:    Patient ID: Phyllis Wilkins, female    DOB: 04-Apr-1981, 31 y.o.   MRN: [id number]  HPI 31 y.o. [id number] who complains of heavy, frequent vaginal bleeding for the past 3 months. She states she has been having periods every other week, lasting up to 7 days with heavy bleeding for all 7 days with large clots (size of her hand). She is using adult incontinence pads now because the bleeding is so heavy and changes pads 8-10 times a day. Her last period started 9/30 and bleeding stopped yesterday. She is not bleeding today. She has cramping and back pain with periods and also between periods. She was seen in the MAU on 9/24 and given Provera. She took it for 2 days but stopped because she was already at the end of her period and it didn't seem to do anything. She prefers not to take medications. She was started on iron for a hemoglobin of 7.8. She feels weak but does not currently have chest pain, palpitations or shortness of breath.   Menarche age 11 years. Prior to 3 months ago, her periods were regular but heavy, lasting 5 days with some golf-ball sized clots. Gonorrhea 3 years ago. BTL in 2009. Mother had similar problem and had hysterectomy. Pt not sure what final diagnosis was but not cancer. She does not remember when her last pap smear was - maybe a few years ago. She has not had an abnormal pap.   Patient also complains of breast problems>  Right breast is larger than it used to be. She has been having intermittent sharp pain in her R nipple radiating to her back. Pain sometimes lasts for an entire day. Started a few months ago. Not related to her menstrual cycles. She states that her R nipple will intermittently retract/invert, not always with pain but thinks the inversion started about the same time. She has had milky-colored discharge on her bra recently. She also feels a firm lump on the lateral aspect of R breast. Her mother was recently diagnosed with stage 2 breast cancer at age 44 and  is undergoing chemotherapy. Several great-aunts on her father's side had breast cancer, some in their 30s. Her mother was adopted so she does not know if other family members on her mother's side had breast cancer. No known family history of ovarian, endometrial or other cancer. The patient did not breastfeed more than 2 weeks. Her last delivery was in 2009.    Review of Systems  Constitutional: Positive for fatigue. Negative for fever, chills, activity change, appetite change and unexpected weight change.  HENT: Negative for nosebleeds.   Eyes: Negative for visual disturbance.  Respiratory: Negative for chest tightness and shortness of breath.   Cardiovascular: Negative for chest pain and palpitations.  Gastrointestinal: Negative for nausea, vomiting and abdominal pain.  Genitourinary: Positive for vaginal bleeding, menstrual problem and pelvic pain. Negative for dysuria, urgency, frequency and vaginal discharge.  Musculoskeletal: Positive for back pain.  Neurological: Negative for dizziness, weakness and light-headedness.       Objective:   Physical Exam  Constitutional: She is oriented to person, place, and time. She appears well-developed and well-nourished. No distress.  HENT:  Head: Normocephalic and atraumatic.  Eyes: Conjunctivae normal and EOM are normal.  Neck: Normal range of motion. Neck supple. No thyromegaly present.  Cardiovascular: Normal rate and regular rhythm.   Murmur (systolic flow murmur) heard. Pulmonary/Chest: Effort normal and breath sounds normal. No respiratory distress.  Abdominal: Soft. There  is no tenderness. There is no rebound and no guarding.  Musculoskeletal: Normal range of motion. She exhibits no edema and no tenderness.  Neurological: She is alert and oriented to person, place, and time.  Skin: Skin is warm and dry.  Psychiatric: She has a normal mood and affect.  Breasts:  Right - normal nipple, no discharge or inversion or retraction. No skin  dimpling or retraction. No masses or other abnormalities. Left:  Normal, no masses, nipple discharge or skin changes. No axillary lymphadenopathy bilaterally.  Filed Vitals:   03/13/12 1414  BP: 119/73  Pulse: 72  Temp: 97.7 F (36.5 C)   Reviewed Prior Studies: Pelvic sono on 9/23 showed polypoid focal echogenic area in lower uterine body with vascular flow measuring 1.4 x 1.6 x 0.9 cm suspicious for endometrial polyp. Normal ovaries and endometrial stripe.  Normal TSH, prolatcin, platelets. Negative wet prep, gonorrhea/chlamydia on 9/23.  Hemoglobin 7.9, Ferritin < 1.     Assessment & Plan:  31 y.o. [id number] with menometrorrhagia  -  Likely due to endometrial polyp -  Pt given Rx for provera and encouraged to start for control of bleeding. -  Needs hysteroscopy. Medicaid application pending. Pt has financial application for [facility] and needs to complete this before procedure to be scheduled.   - Follow up in 2-3 weeks when financial application complete and will schedule hysteroscopy. - Note sent to admin regarding procedure -  Pre-op visit once date is scheduled and financial arrangements made. -  Bilateral diagnostic mammogram (after discussion with radiologist) ordered to be followed by ultrasound if needed.

## 2012-03-14 ENCOUNTER — Other Ambulatory Visit: Payer: Self-pay | Admitting: Family Medicine

## 2012-03-14 DIAGNOSIS — N921 Excessive and frequent menstruation with irregular cycle: Secondary | ICD-10-CM | POA: Insufficient documentation

## 2012-03-17 ENCOUNTER — Telehealth: Payer: Self-pay | Admitting: *Deleted

## 2012-03-17 NOTE — Telephone Encounter (Signed)
Message copied by Jill Side on Fri Mar 17, 2012 11:26 AM ------      Message from: Odelia Gage A      Created: Fri Mar 17, 2012 11:18 AM      Regarding: FW: breast imaging                   ----- Message -----         From: Napoleon Form, MD         Sent: 03/14/2012  11:26 AM           To: [facility], [facility] Admin [facility]      Subject: breast imaging                                           Note:  I ordered breast ultrasound for patient but after talking to radiologist, we will start with mammogram. Ultrasound order should stay in system in case it is needed but may be canceled. Both should be scheduled on same S[facility] [place].        Please call pt to let her know we will start with mammogram. Thanks.

## 2012-03-17 NOTE — Telephone Encounter (Signed)
Called pt and left message that I was calling to discuss an upcoming appt.  Please call back and indicate on nurse voice mail whether or not we may leave a message containing personal medical information.  **Note: Pt needs to have diagnostic mammo and Rt breast ultrasound. She does not have insurance and I have referred her to the [facility] program which will cover the cost of her exams. She will be contacted next week by Martie Lee from [facility] who will inform her of her appt details [facility] the [facility].

## 2012-03-20 ENCOUNTER — Encounter: Payer: Self-pay | Admitting: *Deleted

## 2012-03-20 NOTE — Progress Notes (Signed)
Patient has appointment with [facility] on Friday Oct 18 8:00

## 2012-03-20 NOTE — Telephone Encounter (Signed)
Stanton Kidney called and left a message stating she is returning our call to set up an appt.  States can be reached at this number until 11:30or so- doesn't have a number where messages can be left.  Call her at [phone].

## 2012-03-22 NOTE — Telephone Encounter (Signed)
Per note in EPIC from [place] patient has been given an appt with [facility]

## 2012-03-23 ENCOUNTER — Telehealth: Payer: Self-pay | Admitting: General Practice

## 2012-03-23 NOTE — Telephone Encounter (Signed)
Patient called stating she has been having a lot of lower back pain and abdominal pain with a strong headache and doesn't know what she needs to do and would like a call back. I called patient back and she stated the pain started yesterday afternoon and is not going away it just wants to stay around. I asked patient if she had tried taking anything for the pain and she said she has tried ibuprofen and some vicodin that she had but neither helped and she is out of the vicodin now. Patient also stated she's been having very loose bowel movements with the abdominal pain as well. I asked patient if she felt like the pain was unbearable and if so the best course of action for her would be to go to maternity admissions here so that someone can see her otherwise I can speak with a provider this afternoon and give her a call back later. The patient stated she didn't want to come back here into the hospital and said I can just talk to someone here and give her a call back later this afternoon. Instructed patient I would call back as soon as I could speak with someone, patient verified understanding and had no further questions

## 2012-03-23 NOTE — Telephone Encounter (Signed)
Called patient back and she stated she is still having a lot of lower back pain and abdominal pain and I informed her that I spoke with a provider (Dr Macon Large) and that the best thing for her to do since shes still having severe pain unrelieved by ibuprofen or vicodin, is to come into maternity admissions at the hospital for someone to see her and exam her so that she can get her pain under control and figured out. Patient verbalized understanding and had no further questions

## 2012-03-24 ENCOUNTER — Other Ambulatory Visit: Payer: Self-pay | Admitting: Obstetrics and Gynecology

## 2012-03-24 ENCOUNTER — Ambulatory Visit (HOSPITAL_COMMUNITY)
Admission: RE | Admit: 2012-03-24 | Discharge: 2012-03-24 | Disposition: A | Discharge status: Home / Self Care | Payer: Self-pay | Source: Ambulatory Visit | Attending: Obstetrics and Gynecology | Admitting: Obstetrics and Gynecology

## 2012-03-24 ENCOUNTER — Encounter (HOSPITAL_COMMUNITY): Payer: Self-pay

## 2012-03-24 VITALS — BP 102/60 | Temp 99.4°F | Ht 69.0 in | Wt 214.8 lb

## 2012-03-24 DIAGNOSIS — N898 Other specified noninflammatory disorders of vagina: Secondary | ICD-10-CM

## 2012-03-24 DIAGNOSIS — Z01419 Encounter for gynecological examination (general) (routine) without abnormal findings: Secondary | ICD-10-CM

## 2012-03-24 DIAGNOSIS — N6310 Unspecified lump in the right breast, unspecified quadrant: Secondary | ICD-10-CM

## 2012-03-24 NOTE — Assessment & Plan Note (Signed)
Right breast lump at 6:30 o'clock 2.5 cm from the nipple. Patient referred to the [facility] for bilateral diagnostic mammogram and possible right breast ultrasound. Appointment scheduled for Monday, March 27, 2012 at 1415.

## 2012-03-24 NOTE — Progress Notes (Signed)
Patient complained of right breast mass, pain that comes and goes rating moderate to severe, spontaneous white nipple discharge, and nipple inversion.  Pap Smear:  Completed Pap smear today. Last Pap smear was 08/23/2005 and normal. Per patient no history of abnormal Pap smears. Last Pap smear result in EPIC.  Physical exam: Breasts Breasts symmetrical. No skin abnormalities bilateral breasts. No nipple retraction left breast. Right nipple retracted per patient has noticed this change within the last few months. No nipple discharge left breast. Per patient white spontaneous nipple discharge from right breast. Unable to express nipple discharge on exam. No lymphadenopathy. No lumps palpated left breast. Palpated small lump right breast at 6:30 o'clock 2.5 cm from the nipple. On exam right upper outer quadrant tender. Patient referred to the [facility] for bilateral diagnostic mammogram and possible right breast ultrasound. Appointment scheduled for Monday, March 27, 2012 at 1415.        Pelvic/Bimanual   Ext Genitalia No lesions, no swelling and no discharge observed on external genitalia.         Vagina Vagina pink and normal texture. No lesions and creamy white discharge observed in vagina. Wet prep completed.         Cervix Cervix is present. Cervix pink and of normal texture. Cervix friable. Small amount of creamy white discharge observed on cervix.      Uterus Uterus is present and palpable. Uterus in normal position and normal size.       Adnexae Bilateral ovaries present and palpable. No tenderness on palpation.        Rectovaginal No rectal exam completed today since patient had no rectal complaints. No skin abnormalities observed on exam.

## 2012-03-24 NOTE — Patient Instructions (Addendum)
Taught patient how to perform BSE and gave educational materials to take home.  Told patient if Pap smear today is normal her next Pap smear will be due in 3 years. Let her know [facility] will cover Pap smears every 3 years unless has a history of abnormal Pap smears. Patient referred to the [facility] for bilateral diagnostic mammogram and possible right breast ultrasound. Appointment scheduled for Monday, March 27, 2012 at 1415. Patient aware of appointment and will be there. Let patient know will follow up with her within the next couple weeks with results. Patient verbalized understanding.

## 2012-03-25 LAB — WET PREP, GENITAL

## 2012-03-27 ENCOUNTER — Ambulatory Visit
Admission: RE | Admit: 2012-03-27 | Discharge: 2012-03-27 | Disposition: A | Discharge status: Home / Self Care | Payer: No Typology Code available for payment source | Source: Ambulatory Visit | Attending: Family Medicine | Admitting: Family Medicine

## 2012-03-27 ENCOUNTER — Other Ambulatory Visit: Payer: Self-pay | Admitting: Family Medicine

## 2012-03-27 DIAGNOSIS — N644 Mastodynia: Secondary | ICD-10-CM

## 2012-03-28 ENCOUNTER — Encounter: Payer: Self-pay | Admitting: Family Medicine

## 2012-03-30 ENCOUNTER — Telehealth (HOSPITAL_COMMUNITY): Payer: Self-pay | Admitting: *Deleted

## 2012-03-30 DIAGNOSIS — B9689 Other specified bacterial agents as the cause of diseases classified elsewhere: Secondary | ICD-10-CM | POA: Insufficient documentation

## 2012-03-30 DIAGNOSIS — N760 Acute vaginitis: Secondary | ICD-10-CM | POA: Insufficient documentation

## 2012-03-30 MED ORDER — METRONIDAZOLE 500 MG PO TABS: 500.0000 mg | ORAL_TABLET | Freq: Two times a day (BID) | ORAL | Status: DC

## 2012-03-30 NOTE — Telephone Encounter (Signed)
Telephoned patient at home # and no answer. 

## 2012-03-30 NOTE — Telephone Encounter (Addendum)
Patient returned phone call to me today. Let her know that the wet prep completed on 03/24/12 at [facility] clinic showed BV and that will send a prescription for Flagyl to her pharmacy. Patient told me to send to [facility] on [address]. Let her know if it doesn't clear up or occurs again that she will need to follow up at [facility] Let her know haven't received Pap smear result yet and will let her know result when receive. Patient verbalized understanding.  Prescription for Flagyl sent to [facility] on [address].

## 2012-04-11 ENCOUNTER — Telehealth (HOSPITAL_COMMUNITY): Payer: Self-pay | Admitting: *Deleted

## 2012-04-11 NOTE — Telephone Encounter (Signed)
Telephoned patient at home # and left message to return call to [facility] 

## 2012-04-19 ENCOUNTER — Telehealth: Payer: Self-pay | Admitting: General Practice

## 2012-04-19 NOTE — Telephone Encounter (Signed)
Pt called stating she is having a pain again above her uterus and it's hard to explain but it is very painful even to the touch and she's not bleeding but she is spotting and when she urinates there's blood in it. Patient also states I don't know it's just weird and isn't feeling well at all and would like someone to call back soon. Called patient back and told her I received her message and that it sounds like she might have a urinary tract infection and she could come by the clinic tomorrow for a urine sample to check to see if she does indeed have a UTI or she could also visit her PCP for this as well. Patient stated she would come here to the clinic tomorrow around 8am for a urine sample. Patient also asked if the results from her pap smear were back. I told patient that her pap smear was normal. Patient verbalized understanding and had no further questions

## 2012-04-20 ENCOUNTER — Ambulatory Visit: Payer: Self-pay

## 2012-06-08 NOTE — Addendum Note (Signed)
Encounter addended by: Saintclair Halsted, RN on: 06/08/2012  3:30 PM<BR>     Documentation filed: Charges VN

## 2012-06-25 ENCOUNTER — Emergency Department (HOSPITAL_COMMUNITY)
Admission: EM | Admit: 2012-06-25 | Discharge: 2012-06-25 | Disposition: A | Discharge status: Home / Self Care | Payer: Self-pay | Attending: Emergency Medicine | Admitting: Emergency Medicine

## 2012-06-25 ENCOUNTER — Encounter (HOSPITAL_COMMUNITY): Payer: Self-pay | Admitting: Emergency Medicine

## 2012-06-25 DIAGNOSIS — Z8639 Personal history of other endocrine, nutritional and metabolic disease: Secondary | ICD-10-CM | POA: Insufficient documentation

## 2012-06-25 DIAGNOSIS — T148XXA Other injury of unspecified body region, initial encounter: Secondary | ICD-10-CM

## 2012-06-25 DIAGNOSIS — Z862 Personal history of diseases of the blood and blood-forming organs and certain disorders involving the immune mechanism: Secondary | ICD-10-CM | POA: Insufficient documentation

## 2012-06-25 DIAGNOSIS — F17200 Nicotine dependence, unspecified, uncomplicated: Secondary | ICD-10-CM | POA: Insufficient documentation

## 2012-06-25 DIAGNOSIS — Z8742 Personal history of other diseases of the female genital tract: Secondary | ICD-10-CM | POA: Insufficient documentation

## 2012-06-25 DIAGNOSIS — Z8719 Personal history of other diseases of the digestive system: Secondary | ICD-10-CM | POA: Insufficient documentation

## 2012-06-25 DIAGNOSIS — Y9289 Other specified places as the place of occurrence of the external cause: Secondary | ICD-10-CM | POA: Insufficient documentation

## 2012-06-25 DIAGNOSIS — Z79899 Other long term (current) drug therapy: Secondary | ICD-10-CM | POA: Insufficient documentation

## 2012-06-25 DIAGNOSIS — S31809A Unspecified open wound of unspecified buttock, initial encounter: Secondary | ICD-10-CM | POA: Insufficient documentation

## 2012-06-25 DIAGNOSIS — W260XXA Contact with knife, initial encounter: Secondary | ICD-10-CM | POA: Insufficient documentation

## 2012-06-25 DIAGNOSIS — Y9389 Activity, other specified: Secondary | ICD-10-CM | POA: Insufficient documentation

## 2012-06-25 MED ORDER — OXYCODONE-ACETAMINOPHEN 5-325 MG PO TABS
ORAL_TABLET | ORAL | Status: AC
  Filled 2012-06-25: qty 1

## 2012-06-25 MED ORDER — OXYCODONE-ACETAMINOPHEN 5-325 MG PO TABS
1.0000 | ORAL_TABLET | Freq: Once | ORAL | Status: AC
  Administered 2012-06-25: 1 via ORAL

## 2012-06-25 MED ORDER — HYDROCODONE-ACETAMINOPHEN 5-325 MG PO TABS: 1.0000 | ORAL_TABLET | Freq: Four times a day (QID) | ORAL | Status: DC | PRN

## 2012-06-25 NOTE — ED Notes (Signed)
Reports around 12am sat on a knief. Sustained laceration to left buttocks. Bleeding self resolved.

## 2012-06-25 NOTE — ED Provider Notes (Signed)
History     CSN: [id number]  Arrival date & time 06/25/12  1210   First MD Initiated Contact with Patient 06/25/12 1228      Chief Complaint  Patient presents with  . Injury     HPI The patient reports she sat on her purse last night where she was caring a steak knife and she cut herself in the left leg.  The patient reports her pain is moderate to severe at this time.  She denies weakness of her left lower extremity.  She's had no pallor bladder complaints.  This occurred approximately 12 hours ago.  She stated that she finally came to the emergency room at because a small amount of blood was noted from the area.  Tetanus updated within the past 5 years.  Her symptoms are worsened by palpation.  Nothing improves her pain.  Past Medical History  Diagnosis Date  . Pancreatitis   . Endometriosis   . Thyroid disease     Past Surgical History  Procedure Date  . Cholecystectomy   . Tubal ligation     Family History  Problem Relation Age of Onset  . Hypertension Father   . Diabetes Father   . Cancer Mother     History  Substance Use Topics  . Smoking status: Current Every Day Smoker -- 0.5 packs/day    Types: Cigarettes  . Smokeless tobacco: Not on file  . Alcohol Use: Yes     Comment: occas.    OB History    Grav Para Term Preterm Abortions TAB SAB Ect Mult Living   6 6 1 5      6       Review of Systems  All other systems reviewed and are negative.    Allergies  Morphine and related  Home Medications   Current Outpatient Rx  Name  Route  Sig  Dispense  Refill  . FERROUS SULFATE 325 (65 FE) MG PO TABS   Oral   Take 325 mg by mouth daily with breakfast. Patient is to take 2 tabs bid           BP 109/51  Pulse 90  Temp 97.7 F (36.5 C) (Oral)  Resp 18  SpO2 100%  Physical Exam  Nursing note and vitals reviewed. Constitutional: She is oriented to person, place, and time. She appears well-developed and well-nourished. No distress.  HENT:  Head:  Normocephalic and atraumatic.  Eyes: EOM are normal.  Neck: Normal range of motion.  Cardiovascular: Normal rate, regular rhythm and normal heart sounds.   Pulmonary/Chest: Effort normal and breath sounds normal.  Abdominal: Soft. She exhibits no distension. There is no tenderness.  Musculoskeletal: Normal range of motion.       Laceration with associated puncture wound of the left gluteal region in the lateral aspect with the wound tracking inferiorly down her leg and superficial.  No active bleeding.  5 out of 5 strength in left lower extremity.  Normal pulses in left lower extremity.  The laceration does not head towards the rectum  Neurological: She is alert and oriented to person, place, and time.  Skin: Skin is warm and dry.  Psychiatric: She has a normal mood and affect. Judgment normal.    ED Course  Procedures (including critical care time)  Labs Reviewed - No data to display No results found.   1. Puncture wound       MDM  This is a superficial puncture wound.  This was cleaned at  the bedside after lidocaine was applied.  Will be kept open given 12 hours post puncture.  The laceration tracks superficially down her leg and not towards her sciatic nerve or her rectum.  She has normal pulses distally and normal motor distally        Lyanne Co, MD 06/25/12 1326

## 2012-09-25 ENCOUNTER — Telehealth (HOSPITAL_COMMUNITY): Payer: Self-pay | Admitting: *Deleted

## 2012-09-25 ENCOUNTER — Encounter: Payer: Self-pay | Admitting: *Deleted

## 2012-09-25 NOTE — Telephone Encounter (Signed)
Telephoned patient at home # and left message to return call to [facility] 

## 2012-10-02 ENCOUNTER — Telehealth: Payer: Self-pay | Admitting: *Deleted

## 2012-10-02 NOTE — Telephone Encounter (Signed)
Jewelle called and left a message stating she was having severe pain right lower rib cage as well as having severe period. States was diagnosed with issues with endometriosis.  States when stands up is gushing blood and feels weak. Called Amarilis and she states she feels weak and dizzy and is wearing 2 pads and everytime she stands blood gushes out. States changing pads at least every hour. Also so reports had hgb 8 last visit.  Instructed patient to have someone to bring her  to [facility] for evaluation asap . Patient states she will come today asap.

## 2012-10-13 ENCOUNTER — Encounter (HOSPITAL_COMMUNITY): Payer: Self-pay | Admitting: *Deleted

## 2012-10-13 ENCOUNTER — Telehealth (HOSPITAL_COMMUNITY): Payer: Self-pay | Admitting: *Deleted

## 2012-10-13 NOTE — Telephone Encounter (Signed)
Telephoned patient at home # and not a valid number.

## 2012-10-26 ENCOUNTER — Other Ambulatory Visit: Payer: Self-pay | Admitting: Obstetrics and Gynecology

## 2012-10-26 DIAGNOSIS — R922 Inconclusive mammogram: Secondary | ICD-10-CM

## 2012-11-02 ENCOUNTER — Telehealth: Payer: Self-pay | Admitting: General Practice

## 2012-11-02 ENCOUNTER — Inpatient Hospital Stay (HOSPITAL_COMMUNITY): Payer: Self-pay

## 2012-11-02 ENCOUNTER — Encounter (HOSPITAL_COMMUNITY): Payer: Self-pay | Admitting: *Deleted

## 2012-11-02 ENCOUNTER — Inpatient Hospital Stay (HOSPITAL_COMMUNITY)
Admission: AD | Admit: 2012-11-02 | Discharge: 2012-11-02 | Disposition: A | Discharge status: Home / Self Care | Payer: Self-pay | Source: Ambulatory Visit | Attending: Family Medicine | Admitting: Family Medicine

## 2012-11-02 DIAGNOSIS — R0602 Shortness of breath: Secondary | ICD-10-CM | POA: Insufficient documentation

## 2012-11-02 DIAGNOSIS — Z8742 Personal history of other diseases of the female genital tract: Secondary | ICD-10-CM | POA: Insufficient documentation

## 2012-11-02 DIAGNOSIS — N938 Other specified abnormal uterine and vaginal bleeding: Secondary | ICD-10-CM | POA: Insufficient documentation

## 2012-11-02 DIAGNOSIS — R109 Unspecified abdominal pain: Secondary | ICD-10-CM | POA: Insufficient documentation

## 2012-11-02 DIAGNOSIS — N949 Unspecified condition associated with female genital organs and menstrual cycle: Secondary | ICD-10-CM | POA: Insufficient documentation

## 2012-11-02 HISTORY — DX: Polyp of corpus uteri: N84.0

## 2012-11-02 LAB — URINALYSIS, ROUTINE W REFLEX MICROSCOPIC
Bilirubin Urine: NEGATIVE
Leukocytes, UA: NEGATIVE
Nitrite: NEGATIVE
Specific Gravity, Urine: 1.025 (ref 1.005–1.030)
Urobilinogen, UA: 0.2 mg/dL (ref 0.0–1.0)

## 2012-11-02 LAB — COMPREHENSIVE METABOLIC PANEL
ALT: 9 U/L (ref 0–35)
Albumin: 3.5 g/dL (ref 3.5–5.2)
Alkaline Phosphatase: 56 U/L (ref 39–117)
Glucose, Bld: 84 mg/dL (ref 70–99)
Potassium: 3.4 mEq/L — ABNORMAL LOW (ref 3.5–5.1)
Sodium: 139 mEq/L (ref 135–145)
Total Protein: 6.3 g/dL (ref 6.0–8.3)

## 2012-11-02 LAB — URINE MICROSCOPIC-ADD ON

## 2012-11-02 LAB — CBC
MCV: 77.3 fL — ABNORMAL LOW (ref 78.0–100.0)
Platelets: 225 10*3/uL (ref 150–400)
RDW: 18.1 % — ABNORMAL HIGH (ref 11.5–15.5)
WBC: 5.1 10*3/uL (ref 4.0–10.5)

## 2012-11-02 LAB — WET PREP, GENITAL: Yeast Wet Prep HPF POC: NONE SEEN

## 2012-11-02 MED ORDER — KETOROLAC TROMETHAMINE 60 MG/2ML IM SOLN
60.0000 mg | Freq: Once | INTRAMUSCULAR | Status: DC
  Filled 2012-11-02: qty 2

## 2012-11-02 MED ORDER — MEDROXYPROGESTERONE ACETATE 10 MG PO TABS: 10.0000 mg | ORAL_TABLET | Freq: Every day | ORAL | Status: DC

## 2012-11-02 NOTE — Progress Notes (Signed)
Pt describes pain as a ripping type pain

## 2012-11-02 NOTE — Telephone Encounter (Signed)
Called patient and she stated she has been having this heavy bleeding for months now and for the past couple months she has got her periods twice a month with passing big blood clots. Patient states she experiences dizziness, fatigue and low energy as well and as of today has felt like she is going to pass out if she looks up for very long. Patient has not been seen here since October due to no insurance. Told patient she needs to go to [facility] for immediate evaluation and that I would let our front office staff know that she needs an appt to be seen in the clinic and when she comes here she needs to fill out the financial application again. Patient verbalized understanding to all and said she would go to [facility]. Patient had no further questions

## 2012-11-02 NOTE — MAU Note (Signed)
Patient states she started bleeding and having abdominal pain 3 days ago. Has been diagnosed with polyps and was scheduled for surgery but did not have it. States she passed out today at school and feels very weak.

## 2012-11-02 NOTE — Telephone Encounter (Signed)
Patient called and left message stating she is having heavy bleeding and has a history of endometriosis and is just not feeling well. Patient also states the last time she was here her hemoglobin was low and she has been having big blood clots and would like a call back.

## 2012-11-02 NOTE — Progress Notes (Signed)
Pt states she is having diarrhea

## 2012-11-02 NOTE — MAU Note (Signed)
Pt states she  Has been having bleeding since 10/30/2012. Pt states she had a period  On the 10/18/2012. Pt states she fainted today at school.

## 2012-11-02 NOTE — Progress Notes (Signed)
Pt states she fell today when she fainted.

## 2012-11-02 NOTE — [facility]U Provider Note (Addendum)
History     CSN: [id number]  Arrival date and time: 11/02/12 1747   First Provider Initiated Contact with Patient 11/02/12 1839      Chief Complaint  Patient presents with  . Vaginal Bleeding  . Abdominal Pain   HPI  Pt is a gyn pt here with report of severe pelvic pain that started on 10/30/12 with menstrual period.  Pt has a history of endometriosis and endometrial poly.  Reports changing pad every 1.5 hrs and states she has fainted today.  Pt reports shortness of breath and pain in midsternal area.    Past Medical History  Diagnosis Date  . Pancreatitis   . Endometriosis 2013  . Thyroid disease   . Endometrial polyp     Past Surgical History  Procedure Laterality Date  . Cholecystectomy    . Tubal ligation      Family History  Problem Relation Age of Onset  . Hypertension Father   . Diabetes Father   . Cancer Mother     History  Substance Use Topics  . Smoking status: Current Every Day Smoker -- 0.50 packs/day    Types: Cigarettes  . Smokeless tobacco: Not on file  . Alcohol Use: Yes     Comment: occas.    Allergies:  Allergies  Allergen Reactions  . Morphine And Related Anaphylaxis    No prescriptions prior to admission    Review of Systems  Constitutional: Positive for malaise/fatigue. Negative for fever and chills.  Respiratory: Positive for shortness of breath.   Cardiovascular: Positive for chest pain. Negative for palpitations.  Gastrointestinal: Positive for abdominal pain. Negative for nausea and vomiting.  Genitourinary: Negative for dysuria and urgency.  Neurological: Positive for dizziness, weakness and headaches (head tightness).  All other systems reviewed and are negative.   Physical Exam   Blood pressure 135/81, pulse 72, temperature 98.5 F (36.9 C), temperature source Oral, resp. rate 20, height 5\' 9"  (1.753 m), weight 97.16 kg (214 lb 3.2 oz), last menstrual period 10/30/2012, SpO2 100.00%.  Physical Exam  Constitutional:  She is oriented to person, place, and time. She appears well-developed and well-nourished. No distress.  Appears uncomfortable  HENT:  Head: Normocephalic.  Neck: Normal range of motion. Neck supple.  Cardiovascular: Normal rate, regular rhythm and normal heart sounds.   Respiratory: Effort normal and breath sounds normal.  GI: Soft. She exhibits no mass. There is tenderness (lower pelvis). There is no guarding.  Genitourinary: There is bleeding (moderate bleeding, small clots) around the vagina.  Neurological: She is alert and oriented to person, place, and time. She has normal reflexes.  Skin: Skin is warm and dry.    [facility] Course  Procedures Results for orders placed during the hospital encounter of 11/02/12 (from the past 24 hour(s))  URINALYSIS, ROUTINE W REFLEX MICROSCOPIC     Status: Abnormal   Collection Time    11/02/12  6:10 PM      Result Value Range   Color, Urine YELLOW  YELLOW   APPearance HAZY (*) CLEAR   Specific Gravity, Urine 1.025  1.005 - 1.030   pH 6.5  5.0 - 8.0   Glucose, UA NEGATIVE  NEGATIVE mg/dL   Hgb urine dipstick LARGE (*) NEGATIVE   Bilirubin Urine NEGATIVE  NEGATIVE   Ketones, ur NEGATIVE  NEGATIVE mg/dL   Protein, ur NEGATIVE  NEGATIVE mg/dL   Urobilinogen, UA 0.2  0.0 - 1.0 mg/dL   Nitrite NEGATIVE  NEGATIVE   Leukocytes, UA NEGATIVE  NEGATIVE  URINE MICROSCOPIC-ADD ON     Status: None   Collection Time    11/02/12  6:10 PM      Result Value Range   Squamous Epithelial / LPF RARE  RARE   RBC / HPF 21-50  <3 RBC/hpf   Urine-Other MUCOUS PRESENT    POCT PREGNANCY, URINE     Status: None   Collection Time    11/02/12  6:25 PM      Result Value Range   Preg Test, Ur NEGATIVE  NEGATIVE  WET PREP, GENITAL     Status: Abnormal   Collection Time    11/02/12  6:55 PM      Result Value Range   Yeast Wet Prep HPF POC NONE SEEN  NONE SEEN   Trich, Wet Prep NONE SEEN  NONE SEEN   Clue Cells Wet Prep HPF POC FEW (*) NONE SEEN   WBC, Wet Prep HPF  POC FEW (*) NONE SEEN  CBC     Status: Abnormal   Collection Time    11/02/12  7:10 PM      Result Value Range   WBC 5.1  4.0 - 10.5 K/uL   RBC 3.74 (*) 3.87 - 5.11 MIL/uL   Hemoglobin 9.1 (*) 12.0 - 15.0 g/dL   HCT 09.8 (*) 11.9 - 14.7 %   MCV 77.3 (*) 78.0 - 100.0 fL   MCH 24.3 (*) 26.0 - 34.0 pg   MCHC 31.5  30.0 - 36.0 g/dL   RDW 82.9 (*) 56.2 - 13.0 %   Platelets 225  150 - 400 K/uL    Ultrasound: IMPRESSION:  1. Echogenic material within the endometrial canal is felt to  represent blood clot.  2. Endometrium is normal thickness for a premenopausal female.  3. Uterus is normal.  4. Ovaries are normal.   Assessment and Plan  Report given to M. Mayford Knife who assumes care of patient.  [facility] 11/02/2012, 6:42 PM   Results for orders placed during the hospital encounter of 11/02/12 (from the past 24 hour(s))  URINALYSIS, ROUTINE W REFLEX MICROSCOPIC     Status: Abnormal   Collection Time    11/02/12  6:10 PM      Result Value Range   Color, Urine YELLOW  YELLOW   APPearance HAZY (*) CLEAR   Specific Gravity, Urine 1.025  1.005 - 1.030   pH 6.5  5.0 - 8.0   Glucose, UA NEGATIVE  NEGATIVE mg/dL   Hgb urine dipstick LARGE (*) NEGATIVE   Bilirubin Urine NEGATIVE  NEGATIVE   Ketones, ur NEGATIVE  NEGATIVE mg/dL   Protein, ur NEGATIVE  NEGATIVE mg/dL   Urobilinogen, UA 0.2  0.0 - 1.0 mg/dL   Nitrite NEGATIVE  NEGATIVE   Leukocytes, UA NEGATIVE  NEGATIVE  URINE MICROSCOPIC-ADD ON     Status: None   Collection Time    11/02/12  6:10 PM      Result Value Range   Squamous Epithelial / LPF RARE  RARE   RBC / HPF 21-50  <3 RBC/hpf   Urine-Other MUCOUS PRESENT    POCT PREGNANCY, URINE     Status: None   Collection Time    11/02/12  6:25 PM      Result Value Range   Preg Test, Ur NEGATIVE  NEGATIVE  WET PREP, GENITAL     Status: Abnormal   Collection Time    11/02/12  6:55 PM      Result Value Range   Yeast  Wet Prep HPF POC NONE SEEN  NONE SEEN   Trich, Wet  Prep NONE SEEN  NONE SEEN   Clue Cells Wet Prep HPF POC FEW (*) NONE SEEN   WBC, Wet Prep HPF POC FEW (*) NONE SEEN  CBC     Status: Abnormal   Collection Time    11/02/12  7:10 PM      Result Value Range   WBC 5.1  4.0 - 10.5 K/uL   RBC 3.74 (*) 3.87 - 5.11 MIL/uL   Hemoglobin 9.1 (*) 12.0 - 15.0 g/dL   HCT 16.1 (*) 09.6 - 04.5 %   MCV 77.3 (*) 78.0 - 100.0 fL   MCH 24.3 (*) 26.0 - 34.0 pg   MCHC 31.5  30.0 - 36.0 g/dL   RDW 40.9 (*) 81.1 - 91.4 %   Platelets 225  150 - 400 K/uL  COMPREHENSIVE METABOLIC PANEL     Status: Abnormal   Collection Time    11/02/12  7:10 PM      Result Value Range   Sodium 139  135 - 145 mEq/L   Potassium 3.4 (*) 3.5 - 5.1 mEq/L   Chloride 105  96 - 112 mEq/L   CO2 28  19 - 32 mEq/L   Glucose, Bld 84  70 - 99 mg/dL   BUN 11  6 - 23 mg/dL   Creatinine, Ser 7.82  0.50 - 1.10 mg/dL   Calcium 8.9  8.4 - 95.6 mg/dL   Total Protein 6.3  6.0 - 8.3 g/dL   Albumin 3.5  3.5 - 5.2 g/dL   AST 13  0 - 37 U/L   ALT 9  0 - 35 U/L   Alkaline Phosphatase 56  39 - 117 U/L   Total Bilirubin 0.5  0.3 - 1.2 mg/dL   GFR calc non Af Amer >90  >90 mL/min   GFR calc Af Amer >90  >90 mL/min   US Transvaginal Non-ob  11/02/2012   *RADIOLOGY REPORT*  Clinical Data: Bleeding and pain  TRANSABDOMINAL ULTRASOUND OF PELVIS  Technique:  Transabdominal ultrasound examination of the pelvis was performed including evaluation of the uterus, ovaries, adnexal regions, and pelvic cul-de-sac.  Comparison:  Ultrasound of 02/28/2012 r  Findings:  Uterus:  Normal in size at 9.4 x 5.5 x 7.2 cm.  Endometrium: Normal in thickness at 6 mm for a premenopausal female.  There is an echogenic nodular focus within the endometrial canal which is felt to represent clot within the canal  This clot was  mobile according to the sonographer.  The patient was actively bleeding during the exam.  Right ovary: Normal size of 2.7 x 2.6 x 2.2 cm.  Left ovary: Normal in size at 2.6 x 3.2 x 2.0 cm.  Other Findings:   No free fluid  IMPRESSION:  1.  Echogenic material within the endometrial canal is felt to represent blood clot. 2.  Endometrium is normal thickness for a premenopausal female. 3.  Uterus is normal.  4.  Ovaries are normal.   Original Report Authenticated By: Genevive Bi, M.D.   Discussed with Dr Shawnie Pons. WIll start on Provera until we can get her into clinic.  When discharging her, she asked "what about the chest pain?" and "shortness of breath".  Appears comfortable. Pulse ox 98%. Does not appear dyspneic.   Offered transport to [facility] ED, declines. Advised she needs to go there if she does not improve. PO hydration and Start Provera .   Wynelle Bourgeois  CNM

## 2012-11-03 NOTE — MAU Provider Note (Signed)
Chart reviewed and agree with management and plan.  

## 2012-11-08 ENCOUNTER — Ambulatory Visit
Admission: RE | Admit: 2012-11-08 | Discharge: 2012-11-08 | Disposition: A | Discharge status: Home / Self Care | Payer: No Typology Code available for payment source | Source: Ambulatory Visit | Attending: Obstetrics and Gynecology | Admitting: Obstetrics and Gynecology

## 2012-11-08 DIAGNOSIS — R922 Inconclusive mammogram: Secondary | ICD-10-CM

## 2012-11-16 ENCOUNTER — Encounter (HOSPITAL_COMMUNITY): Payer: Self-pay | Admitting: *Deleted

## 2012-11-16 ENCOUNTER — Inpatient Hospital Stay (HOSPITAL_COMMUNITY): Payer: Self-pay

## 2012-11-16 ENCOUNTER — Inpatient Hospital Stay (HOSPITAL_COMMUNITY)
Admission: AD | Admit: 2012-11-16 | Discharge: 2012-11-16 | Disposition: A | Discharge status: Home / Self Care | Payer: Self-pay | Source: Ambulatory Visit | Attending: Family Medicine | Admitting: Family Medicine

## 2012-11-16 DIAGNOSIS — R51 Headache: Secondary | ICD-10-CM | POA: Insufficient documentation

## 2012-11-16 DIAGNOSIS — N920 Excessive and frequent menstruation with regular cycle: Secondary | ICD-10-CM | POA: Insufficient documentation

## 2012-11-16 DIAGNOSIS — R109 Unspecified abdominal pain: Secondary | ICD-10-CM | POA: Insufficient documentation

## 2012-11-16 HISTORY — DX: Anemia, unspecified: D64.9

## 2012-11-16 LAB — CBC
HCT: 27.4 % — ABNORMAL LOW (ref 36.0–46.0)
Hemoglobin: 8.7 g/dL — ABNORMAL LOW (ref 12.0–15.0)
MCH: 25.1 pg — ABNORMAL LOW (ref 26.0–34.0)
MCHC: 31.8 g/dL (ref 30.0–36.0)
RBC: 3.46 MIL/uL — ABNORMAL LOW (ref 3.87–5.11)

## 2012-11-16 LAB — WET PREP, GENITAL: Yeast Wet Prep HPF POC: NONE SEEN

## 2012-11-16 MED ORDER — IBUPROFEN 800 MG PO TABS: 800.0000 mg | ORAL_TABLET | Freq: Three times a day (TID) | ORAL | Status: DC

## 2012-11-16 MED ORDER — KETOROLAC TROMETHAMINE 30 MG/ML IJ SOLN
30.0000 mg | Freq: Once | INTRAMUSCULAR | Status: AC
  Administered 2012-11-16: 30 mg via INTRAVENOUS
  Filled 2012-11-16: qty 1

## 2012-11-16 MED ORDER — SODIUM CHLORIDE 0.9 % IV SOLN
INTRAVENOUS | Status: DC
  Administered 2012-11-16: 12:00:00 via INTRAVENOUS

## 2012-11-16 MED ORDER — MEGESTROL ACETATE 40 MG PO TABS: 40.0000 mg | ORAL_TABLET | Freq: Two times a day (BID) | ORAL | Status: DC

## 2012-11-16 MED ORDER — OXYCODONE-ACETAMINOPHEN 5-325 MG PO TABS: 2.0000 | ORAL_TABLET | ORAL | Status: DC | PRN

## 2012-11-16 MED ORDER — MEGESTROL ACETATE 40 MG PO TABS
40.0000 mg | ORAL_TABLET | Freq: Every day | ORAL | Status: DC
  Administered 2012-11-16: 40 mg via ORAL
  Filled 2012-11-16 (×2): qty 1

## 2012-11-16 NOTE — MAU Provider Note (Signed)
History     CSN: [id number]  Arrival date and time: 11/16/12 0901   First Provider Initiated Contact with Patient 11/16/12 [phone]      Chief Complaint  Patient presents with  . Vaginal Bleeding   HPI  Pt is not pregnant and presents with menorrhagia.  Pt has previously been seen  Here with menorrhagia- and was to have f/u appt in clinic but pt says they did not call her for an appointment.  Pt started having heavy bleeding on Saturday 6/8 changing 2 heavy pads every 20 minutes.  Pt goes to school in [employer] [facility] and went to [facility] where she was admitted on Sat or Sunday for a blood transfusion and started on Lysteda 3 times daily but pt states her bleeding has not stopped.  Pt was discharged from the the hospital yesterday 11/16/2010.  Pt has been nauseated and not able to eat much- she had a piece of honeybun 2 hours ago at 8 am.  Pt has not been able to go to pharmacy since d/c from hospital.  Pt has not taken anything for pain but is having intense cramping.  Pt is very weak.  Past Medical History  Diagnosis Date  . Pancreatitis   . Endometriosis 2013  . Thyroid disease   . Endometrial polyp   . Anemia     Past Surgical History  Procedure Laterality Date  . Cholecystectomy    . Tubal ligation      Family History  Problem Relation Age of Onset  . Hypertension Father   . Diabetes Father   . Cancer Mother     History  Substance Use Topics  . Smoking status: Current Every Day Smoker -- 0.50 packs/day    Types: Cigarettes  . Smokeless tobacco: Not on file  . Alcohol Use: Yes     Comment: occas.    Allergies:  Allergies  Allergen Reactions  . Morphine And Related Anaphylaxis    Prescriptions prior to admission  Medication Sig Dispense Refill  . tranexamic acid (LYSTEDA) 650 MG TABS Take 1,300 mg by mouth.      . medroxyPROGESTERone (PROVERA) 10 MG tablet Take 1 tablet (10 mg total) by mouth daily.  30 tablet  1    ROS Physical Exam   Blood pressure 122/68,  pulse 111, temperature 98.7 F (37.1 C), temperature source Oral, resp. rate 18, height 5\' 7"  (1.702 m), weight 92.08 kg (203 lb), last menstrual period 11/11/2012.  Physical Exam  Vitals reviewed. Constitutional: She is oriented to person, place, and time. She appears well-developed and well-nourished.  Uncomfortable appearing  HENT:  Head: Normocephalic.  Eyes: Pupils are equal, round, and reactive to light.  Neck: Normal range of motion. Neck supple.  Respiratory: Effort normal.  GI: Soft. There is tenderness. There is no rebound.  Genitourinary:  Mod amount of dark red malodorous blood with walnut sized clots in vault; cervix closed, clean; uterus mildly tender- no appreciable enlargement-  Musculoskeletal: Normal range of motion.  Neurological: She is alert and oriented to person, place, and time.  Skin: Skin is warm and dry.  Psychiatric: She has a normal mood and affect.    MAU Course  Procedures Discussed pt with Dr. Shawnie Pons- will repeat ultrasound and will give Megace 40mg  Will call GYN clinic and see if pt can be worked in clinic Toradol 30mg  IV given with some relief of abd pain and headache``` Bleeding diminished after Megace````````` Results for orders placed during the hospital encounter of  11/16/12 (from the past 24 hour(s))  CBC     Status: Abnormal   Collection Time    11/16/12  9:48 AM      Result Value Range   WBC 9.0  4.0 - 10.5 K/uL   RBC 3.46 (*) 3.87 - 5.11 MIL/uL   Hemoglobin 8.7 (*) 12.0 - 15.0 g/dL   HCT 21.3 (*) 08.6 - 57.8 %   MCV 79.2  78.0 - 100.0 fL   MCH 25.1 (*) 26.0 - 34.0 pg   MCHC 31.8  30.0 - 36.0 g/dL   RDW 46.9 (*) 62.9 - 52.8 %   Platelets 237  150 - 400 K/uL  ABO/RH     Status: None   Collection Time    11/16/12  9:49 AM      Result Value Range   ABO/RH(D) O POS    WET PREP, GENITAL     Status: Abnormal   Collection Time    11/16/12 10:07 AM      Result Value Range   Yeast Wet Prep HPF POC NONE SEEN  NONE SEEN   Trich, Wet Prep  NONE SEEN  NONE SEEN   Clue Cells Wet Prep HPF POC FEW (*) NONE SEEN   WBC, Wet Prep HPF POC FEW (*) NONE SEEN  US Transvaginal Non-ob  11/16/2012   *RADIOLOGY REPORT*  Clinical Data: Menorrhagia. LMP 11/11/2012.  TRANSVAGINAL ULTRASOUND OF PELVIS  Technique:  Transvaginal ultrasound examination of the pelvis was performed including evaluation of the uterus, ovaries, adnexal regions, and pelvic cul-de-sac.  Comparison:  11/02/2012  Findings:  Uterus:     8.7 x 5.8 x 6.1 cm.  No fibroids identified.  Endometrium:      Double layer endometrial thickness measures a maximum of 14 mm transvaginally.  There are at least 1 hour to focal hyperechoic lesions within the endometrial stripe measuring less than 1 cm, which are suspicious for endometrial polyps.  Right Ovary: 2.5 x 3.1 x 2.5 cm.  Normal appearance.  No adnexal mass identified.  Left Ovary: 4.1 x 3.5 x 3.0 cm.  Normal appearance.  No adnexal mass identified.  Other Findings:  Trace free fluid noted in cul-de-sac.  IMPRESSION:  1.  Focal hyperechoic area is seen in the endometrium, suspicious for polyps. Consider further evaluation with sonohysterogram for confirmation prior to hysteroscopy.  Endometrial sampling should also be considered if patient is at high risk for endometrial carcinoma. (Ref:  Radiological Reasoning: Algorithmic Workup of Abnormal Vaginal Bleeding with Endovaginal Sonography and Sonohysterography. AJR 2008; 413:K44-01) 2.  Normal ovaries.  No adnexal mass identified.   Original Report Authenticated By: Myles Rosenthal, M.D.  called GYN clinic- pt has appt scheduled June 27, but pt will be worked in tomorrow at 8:15am to see Dr. Erin Fulling Assessment and Plan  Menorrhagia pt to f/u in GYN clinic as scheduled tomorrow at 8:15am with Dr. Erin Fulling- pt to bring any records from previous admission at [facility] Pt may repeat Megace 40mg  this evening if bleeding has not stopped Prescription for Percocet and Ibuprofen 800mg   given as well Note given for work and school until 11/20/2012  [facility] 11/16/2012, 9:48 AM

## 2012-11-16 NOTE — MAU Note (Addendum)
Was dc'd from [facility] yesterday.  Had admitted for bleeding and received a transfusion..  Was given medication to help slow the bleeding, doesn't seem to be working.  Continues to change hourly. Head 'feels tired'.  Bled for a wk, started 05/26, then started again on the 7th.

## 2012-11-16 NOTE — MAU Provider Note (Signed)
Chart reviewed and agree with management and plan.  

## 2012-11-17 ENCOUNTER — Ambulatory Visit (INDEPENDENT_AMBULATORY_CARE_PROVIDER_SITE_OTHER): Payer: Self-pay | Admitting: Obstetrics & Gynecology

## 2012-11-17 ENCOUNTER — Encounter: Payer: Self-pay | Admitting: Obstetrics & Gynecology

## 2012-11-17 VITALS — BP 108/61 | HR 83 | Temp 97.0°F | Ht 69.0 in | Wt 203.1 lb

## 2012-11-17 DIAGNOSIS — N920 Excessive and frequent menstruation with regular cycle: Secondary | ICD-10-CM

## 2012-11-17 DIAGNOSIS — N840 Polyp of corpus uteri: Secondary | ICD-10-CM

## 2012-11-17 NOTE — Patient Instructions (Addendum)
THank you for coming in today Please continue to take the Megace for your bleeding You are scheduled for your hysteroscopy and endometrial ablation on 9/19. Please do not eat anything after midnight the night before Please arrive here by 7:30. Please bring in your paperwork by Monday 6/16 at the latest.  Please call with any further questions  Endometrial Ablation Endometrial ablation removes the lining of the uterus (endometrium). It is usually a same day, outpatient treatment. Ablation helps avoid major surgery (such as a hysterectomy). A hysterectomy is removal of the cervix and uterus. Endometrial ablation has less risk and complications, has a shorter recovery period and is less expensive. After endometrial ablation, most women will have little or no menstrual bleeding. You may not keep your fertility. Pregnancy is no longer likely after this procedure but if you are pre-menopausal, you still need to use a reliable method of birth control following the procedure because pregnancy can occur. REASONS TO HAVE THE PROCEDURE MAY INCLUDE:  Heavy periods.  Bleeding that is causing anemia.  Anovulatory bleeding, very irregular, bleeding.  Bleeding submucous fibroids (on the lining inside the uterus) if they are smaller than 3 centimeters. REASONS NOT TO HAVE THE PROCEDURE MAY INCLUDE:  You wish to have more children.  You have a pre-cancerous or cancerous problem. The cause of any abnormal bleeding must be diagnosed before having the procedure.  You have pain coming from the uterus.  You have a submucus fibroid larger than 3 centimeters.  You recently had a baby.  You recently had an infection in the uterus.  You have a severe retro-flexed, tipped uterus and cannot insert the instrument to do the ablation.  You had a Cesarean section or deep major surgery on the uterus.  The inner cavity of the uterus is too large for the endometrial ablation instrument. RISKS AND COMPLICATIONS    Perforation of the uterus.  Bleeding.  Infection of the uterus, bladder or vagina.  Injury to surrounding organs.  Cutting the cervix.  An air bubble to the lung (air embolus).  Pregnancy following the procedure.  Failure of the procedure to help the problem requiring hysterectomy.  Decreased ability to diagnose cancer in the lining of the uterus. BEFORE THE PROCEDURE  The lining of the uterus must be tested to make sure there is no pre-cancerous or cancer cells present.  Medications may be given to make the lining of the uterus thinner.  Ultrasound may be used to evaluate the size and look for abnormalities of the uterus.  Future pregnancy is not desired. PROCEDURE  There are different ways to destroy the lining of the uterus.   Resectoscope - radio frequency-alternating electric current is the most common one used.  Cryotherapy - freezing the lining of the uterus.  Heated Free Liquid - heated salt (saline) solution inserted into the uterus.  Microwave - uses high energy microwaves in the uterus.  Thermal Balloon - a catheter with a balloon tip is inserted into the uterus and filled with heated fluid. Your caregiver will talk with you about the method used in this clinic. They will also instruct you on the pros and cons of the procedure. Endometrial ablation is performed along with a procedure called operative hysteroscopy. A narrow viewing tube is inserted through the birth canal (vagina) and through the cervix into the uterus. A tiny camera attached to the viewing tube (hysteroscope) allows the uterine cavity to be shown on a TV monitor during surgery. Your uterus is filled with  a harmless liquid to make the procedure easier. The lining of the uterus is then removed. The lining can also be removed with a resectoscope which allows your surgeon to cut away the lining of the uterus under direct vision. Usually, you will be able to go home within an hour after the  procedure. HOME CARE INSTRUCTIONS   Do not drive for 24 hours.  No tampons, douching or intercourse for 2 weeks or until your caregiver approves.  Rest at home for 24 to 48 hours. You may then resume normal activities unless told differently by your caregiver.  Take your temperature two times a day for 4 days, and record it.  Take any medications your caregiver has ordered, as directed.  Use some form of contraception if you are pre-menopausal and do not want to get pregnant. Bleeding after the procedure is normal. It varies from light spotting and mildly watery to bloody discharge for 4 to 6 weeks. You may also have mild cramping. Only take over-the-counter or prescription medicines for pain, discomfort, or fever as directed by your caregiver. Do not use aspirin, as this may aggravate bleeding. Frequent urination during the first 24 hours is normal. You will not know how effective your surgery is until at least 3 months after the surgery. SEEK IMMEDIATE MEDICAL CARE IF:   Bleeding is heavier than a normal menstrual cycle.  An oral temperature above 102 F (38.9 C) develops.  You have increasing cramps or pains not relieved with medication or develop belly (abdominal) pain which does not seem to be related to the same area of earlier cramping and pain.  You are light headed, weak or have fainting episodes.  You develop pain in the shoulder strap areas.  You have chest or leg pain.  You have abnormal vaginal discharge.  You have painful urination. Document Released: 04/02/2004 Document Revised: 08/16/2011 Document Reviewed: 07/01/2007 [facility] Patient Information 2014 [place], [place].   Menorrhagia Dysfunctional uterine bleeding is different from a normal menstrual period. When periods are heavy or there is more bleeding than is usual for you, it is called menorrhagia. It may be caused by hormonal imbalance, or physical, metabolic, or other problems. Examination is necessary in  order that your caregiver may treat treatable causes. If this is a continuing problem, a D&C may be needed. That means that the cervix (the opening of the uterus or womb) is dilated (stretched larger) and the lining of the uterus is scraped out. The tissue scraped out is then examined under a microscope by a specialist (pathologist) to make sure there is nothing of concern that needs further or more extensive treatment. HOME CARE INSTRUCTIONS   If medications were prescribed, take exactly as directed. Do not change or switch medications without consulting your caregiver.  Long term heavy bleeding may result in iron deficiency. Your caregiver may have prescribed iron pills. They help replace the iron your body lost from heavy bleeding. Take exactly as directed. Iron may cause constipation. If this becomes a problem, increase the bran, fruits, and roughage in your diet.  Do not take aspirin or medicines that contain aspirin one week before or during your menstrual period. Aspirin may make the bleeding worse.  If you need to change your sanitary pad or tampon more than once every 2 hours, stay in bed and rest as much as possible until the bleeding stops.  Eat well-balanced meals. Eat foods high in iron. Examples are leafy green vegetables, meat, liver, eggs, and whole grain breads  and cereals. Do not try to lose weight until the abnormal bleeding has stopped and your blood iron level is back to normal. SEEK MEDICAL CARE IF:   You need to change your sanitary pad or tampon more than once an hour.  You develop nausea (feeling sick to your stomach) and vomiting, dizziness, or diarrhea while you are taking your medicine.  You have any problems that may be related to the medicine you are taking. SEEK IMMEDIATE MEDICAL CARE IF:   You have a fever.  You develop chills.  You develop severe bleeding or start to pass blood clots.  You feel dizzy or faint. MAKE SURE YOU:   Understand these  instructions.  Will watch your condition.  Will get help right away if you are not doing well or get worse. Document Released: 05/24/2005 Document Revised: 08/16/2011 Document Reviewed: 01/12/2008 [facility] Patient Information 2014 [place], [place].

## 2012-11-17 NOTE — Progress Notes (Signed)
Patient ID: Phyllis Wilkins, female   DOB: [birth year], 32 y.o.   MRN: [id number] The patient was seen in the MAU and at [facility] and was s/p a transfusion for sx anemia.  Not bleeding today.  Wants hysterectomy.  Heavy bleeding with cycles for years but, now continuous.  Wants treatment. Patient desires surgical management with hysteroscopy, D&C and endometrial ablation.   The risks of surgery were discussed in detail with the patient including but not limited to: bleeding which may require transfusion or reoperation; infection which may require prolonged hospitalization or re-hospitalization and antibiotic therapy; injury to bowel, bladder, ureters and major vessels or other surrounding organs; need for additional procedures including laparotomy; thromboembolic phenomenon, incisional problems and other postoperative or anesthesia complications.  Patient was told that the likelihood that her condition and symptoms will be treated effectively with this surgical management was very high; the postoperative expectations were also discussed in detail. The patient also understands the alternative treatment options which were discussed in full. All questions were answered.  She was given the date of June 19th and was told that she will be contacted by our surgical scheduler regarding the time of her surgery; routine preoperative instructions of having nothing to eat or drink after midnight on the day prior to surgery and also coming to the hospital 1 1/2 hours prior to her time of surgery were also emphasized.  She was told she may be called for a preoperative appointment about a week prior to surgery and will be given further preoperative instructions at that visit. Printed patient education handouts about the procedure were given to the patient to review at home.

## 2012-11-23 SURGERY — DILATATION & CURETTAGE/HYSTEROSCOPY WITH NOVASURE ABLATION
Anesthesia: Choice

## 2012-11-23 MED ORDER — PROPOFOL 10 MG/ML IV EMUL
INTRAVENOUS | Status: AC
  Filled 2012-11-23: qty 20

## 2012-11-23 MED ORDER — FENTANYL CITRATE 0.05 MG/ML IJ SOLN
INTRAMUSCULAR | Status: AC
  Filled 2012-11-23: qty 2

## 2012-11-23 MED ORDER — LIDOCAINE HCL (CARDIAC) 20 MG/ML IV SOLN
INTRAVENOUS | Status: AC
  Filled 2012-11-23: qty 5

## 2012-11-23 MED ORDER — ONDANSETRON HCL 4 MG/2ML IJ SOLN
INTRAMUSCULAR | Status: AC
  Filled 2012-11-23: qty 2

## 2012-11-23 MED ORDER — KETOROLAC TROMETHAMINE 30 MG/ML IJ SOLN
INTRAMUSCULAR | Status: AC
  Filled 2012-11-23: qty 1

## 2012-11-23 MED ORDER — MIDAZOLAM HCL 2 MG/2ML IJ SOLN
INTRAMUSCULAR | Status: AC
  Filled 2012-11-23: qty 2

## 2012-11-27 ENCOUNTER — Ambulatory Visit (HOSPITAL_COMMUNITY): Admission: RE | Admit: 2012-11-27 | Payer: Self-pay | Source: Ambulatory Visit | Admitting: Obstetrics & Gynecology

## 2012-11-30 ENCOUNTER — Encounter: Payer: Self-pay | Admitting: Obstetrics and Gynecology

## 2012-11-30 ENCOUNTER — Telehealth: Payer: Self-pay | Admitting: General Practice

## 2012-11-30 ENCOUNTER — Other Ambulatory Visit: Payer: Self-pay | Admitting: Obstetrics and Gynecology

## 2012-11-30 MED ORDER — MEGESTROL ACETATE 40 MG PO TABS: 40.0000 mg | ORAL_TABLET | Freq: Two times a day (BID) | ORAL | Status: DC

## 2012-11-30 NOTE — Telephone Encounter (Signed)
Called patient back twice- the first time heard busy signal. The second time someone picked up the phone but didn't say anything. Patient needs to be informed that no one else will do her surgery and she will have to wait till Dr Erin Fulling comes back the end of August

## 2012-11-30 NOTE — Telephone Encounter (Signed)
Patient called and left message stating she is having bleeding again and is not pregnant and was given a Rx but now the medicine is out and her hemoglobin was low last time and this is the 4th time she is bleeding this month and really needs the medication refilled. Called patient back and told her I received her message about needing a refill and she stated she already came by the clinic and got the refill but wants to know when her surgery can be rescheduled, she missed it last week because she thought it was at 8 but apparently it was at 7. Told the patient that her doctor will be out of the office for 6 weeks and when she comes back, the surgery will be rescheduled. Patient stated she could not wait that long because she is bleeding so much and this is disabling her and she is unable to go to work or school and wants someone else to do the surgery. Told the patient I could speak to another provider this afternoon but was unsure if someone else would do the surgery since she is a patient of Dr Erin Fulling. The patient stated again that she cannot wait 6 weeks to have the surgery done and she has only seen the doctor one time. Told patient I can speak with a provider this afternoon and call her back later. Patient verbalized understanding and had no further questions

## 2012-12-01 ENCOUNTER — Encounter: Payer: Self-pay | Admitting: Advanced Practice Midwife

## 2012-12-04 NOTE — Telephone Encounter (Signed)
Patient called back and asked about the status of having her surgery rescheduled. I informed her that no other provider would do her surgery and that she will have to wait till August when Dr Erin Fulling comes back. Patient verbalized understanding and asked about getting her out of work note extended to Saturday rather than Friday. I told the patient that the doctor who wrote the note is out of the office till Wednesday and I can ask her at that time. Patient verbalized understanding and asked if she could get a note for when her cycle comes on about every 2-3 weeks because of all the problems she is having she is unable to go to work or school. Told patient I don't anticipate someone writing her out of work that long till August. Patient stated that's not what she was asking, she was asking if when she is having issues from now to august if she can call and get a note. Told patient she would need to be seen by a provider for each basis and that we would not normally just write someone out whenever needed. Patient verbalized understanding and had no further questions

## 2012-12-06 ENCOUNTER — Encounter: Payer: Self-pay | Admitting: General Practice

## 2012-12-07 NOTE — Telephone Encounter (Addendum)
Called pt and informed pt that her letter is here to pick up.  She just needs to come to front desk and pick up.  Also that this is last time that we will be able to extend the letter with out her coming in for evaluation.  Pt stated understanding and no further questions.

## 2013-12-06 ENCOUNTER — Other Ambulatory Visit (HOSPITAL_COMMUNITY): Payer: Self-pay | Admitting: *Deleted

## 2013-12-06 DIAGNOSIS — Z09 Encounter for follow-up examination after completed treatment for conditions other than malignant neoplasm: Secondary | ICD-10-CM

## 2013-12-11 ENCOUNTER — Encounter (HOSPITAL_COMMUNITY): Payer: Self-pay

## 2013-12-11 ENCOUNTER — Ambulatory Visit (HOSPITAL_COMMUNITY)
Admission: RE | Admit: 2013-12-11 | Discharge: 2013-12-11 | Disposition: A | Discharge status: Home / Self Care | Payer: Self-pay | Source: Ambulatory Visit | Attending: Obstetrics and Gynecology | Admitting: Obstetrics and Gynecology

## 2013-12-11 VITALS — BP 116/72 | Temp 98.6°F | Ht 69.0 in | Wt 218.8 lb

## 2013-12-11 DIAGNOSIS — Z1239 Encounter for other screening for malignant neoplasm of breast: Secondary | ICD-10-CM

## 2013-12-11 DIAGNOSIS — N644 Mastodynia: Secondary | ICD-10-CM | POA: Insufficient documentation

## 2013-12-11 NOTE — Progress Notes (Signed)
[facility] due to recommending follow-up diagnostic mammogram. Left breast diagnostic mammogram completed 11/08/2012. Patient complained of bilateral outer breast tenderness that is greater in the left breast. Patient states the pain comes and goes. Patient rates pain at a 10 out of 10 when occurs.  Pap Smear:  Pap smear not completed today. [facility] and normal. Per patient has no history of an abnormal Pap smear. Last Pap smear is result in EPIC.  Physical exam: Breasts Breasts symmetrical. No skin abnormalities bilateral breasts. No nipple retraction bilateral breasts. No nipple discharge bilateral breasts. No lymphadenopathy. No lumps palpated bilateral breasts. No complaints of pain or tenderness on exam.  [facility] bilateral diagnostic mammogram per recommendation. Appointment scheduled for Tuesday, December 21, 2013 at 0815.      Pelvic/Bimanual No Pap smear completed today since last Pap smear was 03/24/2012. Pap smear not indicated per [facility]CP guidelines.

## 2013-12-11 NOTE — Patient Instructions (Signed)
Taught Phyllis Wilkins how to perform BSE and gave educational materials to take home. Patient did not need a Pap smear today due to last Pap smear was 03/24/2012. Let her know [facility] will cover Pap smears every 3 years unless has a history of abnormal Pap smears. [facility] bilateral diagnostic mammogram per recommendation. Appointment scheduled for Tuesday, December 21, 2013 at 0815. Patient aware of appointment and will be there. Phyllis Wilkins verbalized understanding.  Waymon Laser, Kathaleen Maserhristine Poll, RN 10:01 AM

## 2013-12-18 ENCOUNTER — Encounter (INDEPENDENT_AMBULATORY_CARE_PROVIDER_SITE_OTHER): Payer: Self-pay

## 2013-12-18 ENCOUNTER — Ambulatory Visit
Admission: RE | Admit: 2013-12-18 | Discharge: 2013-12-18 | Disposition: A | Discharge status: Home / Self Care | Payer: No Typology Code available for payment source | Source: Ambulatory Visit | Attending: Obstetrics and Gynecology | Admitting: Obstetrics and Gynecology

## 2013-12-18 DIAGNOSIS — Z09 Encounter for follow-up examination after completed treatment for conditions other than malignant neoplasm: Secondary | ICD-10-CM

## 2014-03-08 ENCOUNTER — Emergency Department (HOSPITAL_COMMUNITY): Payer: Self-pay

## 2014-03-08 ENCOUNTER — Encounter (HOSPITAL_COMMUNITY): Payer: Self-pay | Admitting: Emergency Medicine

## 2014-03-08 ENCOUNTER — Emergency Department (HOSPITAL_COMMUNITY)
Admission: EM | Admit: 2014-03-08 | Discharge: 2014-03-08 | Disposition: A | Discharge status: Home / Self Care | Payer: Self-pay | Attending: Emergency Medicine | Admitting: Emergency Medicine

## 2014-03-08 DIAGNOSIS — Z862 Personal history of diseases of the blood and blood-forming organs and certain disorders involving the immune mechanism: Secondary | ICD-10-CM | POA: Insufficient documentation

## 2014-03-08 DIAGNOSIS — Z8639 Personal history of other endocrine, nutritional and metabolic disease: Secondary | ICD-10-CM | POA: Insufficient documentation

## 2014-03-08 DIAGNOSIS — Z720 Tobacco use: Secondary | ICD-10-CM | POA: Insufficient documentation

## 2014-03-08 DIAGNOSIS — N939 Abnormal uterine and vaginal bleeding, unspecified: Secondary | ICD-10-CM | POA: Insufficient documentation

## 2014-03-08 DIAGNOSIS — Z8719 Personal history of other diseases of the digestive system: Secondary | ICD-10-CM | POA: Insufficient documentation

## 2014-03-08 DIAGNOSIS — Z3202 Encounter for pregnancy test, result negative: Secondary | ICD-10-CM | POA: Insufficient documentation

## 2014-03-08 LAB — URINALYSIS, ROUTINE W REFLEX MICROSCOPIC
BILIRUBIN URINE: NEGATIVE
Glucose, UA: NEGATIVE mg/dL
KETONES UR: NEGATIVE mg/dL
Nitrite: NEGATIVE
PROTEIN: NEGATIVE mg/dL
Specific Gravity, Urine: 1.024 (ref 1.005–1.030)
UROBILINOGEN UA: 0.2 mg/dL (ref 0.0–1.0)
pH: 6.5 (ref 5.0–8.0)

## 2014-03-08 LAB — COMPREHENSIVE METABOLIC PANEL
ALBUMIN: 3.3 g/dL — AB (ref 3.5–5.2)
ALK PHOS: 64 U/L (ref 39–117)
ALT: 9 U/L (ref 0–35)
ANION GAP: 9 (ref 5–15)
AST: 11 U/L (ref 0–37)
BUN: 17 mg/dL (ref 6–23)
CALCIUM: 8.5 mg/dL (ref 8.4–10.5)
CO2: 25 mEq/L (ref 19–32)
Chloride: 106 mEq/L (ref 96–112)
Creatinine, Ser: 0.84 mg/dL (ref 0.50–1.10)
GFR calc Af Amer: >90 mL/min (ref >90)
GFR calc non Af Amer: >90 mL/min (ref >90)
Glucose, Bld: 95 mg/dL (ref 70–99)
POTASSIUM: 3.4 meq/L — AB (ref 3.7–5.3)
SODIUM: 140 meq/L (ref 137–147)
TOTAL PROTEIN: 6.7 g/dL (ref 6.0–8.3)
Total Bilirubin: 0.4 mg/dL (ref 0.3–1.2)

## 2014-03-08 LAB — URINE MICROSCOPIC-ADD ON

## 2014-03-08 LAB — CBC WITH DIFFERENTIAL/PLATELET
BASOS PCT: 0 % (ref 0–1)
Basophils Absolute: 0 10*3/uL (ref 0.0–0.1)
EOS ABS: 0.4 10*3/uL (ref 0.0–0.7)
Eosinophils Relative: 5 % (ref 0–5)
HCT: 28.4 % — ABNORMAL LOW (ref 36.0–46.0)
HEMOGLOBIN: 8.3 g/dL — AB (ref 12.0–15.0)
LYMPHS ABS: 2.4 10*3/uL (ref 0.7–4.0)
Lymphocytes Relative: 29 % (ref 12–46)
MCH: 21.1 pg — AB (ref 26.0–34.0)
MCHC: 29.2 g/dL — AB (ref 30.0–36.0)
MCV: 72.1 fL — ABNORMAL LOW (ref 78.0–100.0)
MONO ABS: 0.7 10*3/uL (ref 0.1–1.0)
Monocytes Relative: 9 % (ref 3–12)
NEUTROS ABS: 4.6 10*3/uL (ref 1.7–7.7)
NEUTROS PCT: 57 % (ref 43–77)
Platelets: 276 10*3/uL (ref 150–400)
RBC: 3.94 MIL/uL (ref 3.87–5.11)
RDW: 16.6 % — ABNORMAL HIGH (ref 11.5–15.5)
WBC: 8.1 10*3/uL (ref 4.0–10.5)

## 2014-03-08 LAB — LIPASE, BLOOD: Lipase: 26 U/L (ref 11–59)

## 2014-03-08 LAB — WET PREP, GENITAL
Trich, Wet Prep: NONE SEEN
Yeast Wet Prep HPF POC: NONE SEEN

## 2014-03-08 LAB — PREGNANCY, URINE: PREG TEST UR: NEGATIVE

## 2014-03-08 MED ORDER — HYDROCODONE-ACETAMINOPHEN 5-325 MG PO TABS
2.0000 | ORAL_TABLET | Freq: Once | ORAL | Status: AC
  Administered 2014-03-08: 2 via ORAL
  Filled 2014-03-08: qty 2

## 2014-03-08 MED ORDER — HYDROCODONE-ACETAMINOPHEN 5-325 MG PO TABS: 1.0000 | ORAL_TABLET | Freq: Two times a day (BID) | ORAL | Status: DC | PRN

## 2014-03-08 MED ORDER — OXYCODONE HCL 5 MG PO TABS
10.0000 mg | ORAL_TABLET | Freq: Once | ORAL | Status: AC
  Administered 2014-03-08: 10 mg via ORAL
  Filled 2014-03-08: qty 2

## 2014-03-08 MED ORDER — ONDANSETRON 4 MG PO TBDP
4.0000 mg | ORAL_TABLET | Freq: Once | ORAL | Status: AC
  Administered 2014-03-08: 4 mg via ORAL
  Filled 2014-03-08: qty 1

## 2014-03-08 MED ORDER — ONDANSETRON 4 MG PO TBDP
4.0000 mg | ORAL_TABLET | Freq: Once | ORAL | Status: AC
Start: 2014-03-08 — End: 2014-03-08
  Administered 2014-03-08: 4 mg via ORAL
  Filled 2014-03-08: qty 1

## 2014-03-08 NOTE — ED Provider Notes (Signed)
CSN: [id number]     Arrival date & time 03/08/14  0348 History   First MD Initiated Contact with Patient 03/08/14 0403     Chief Complaint  Patient presents with  . Vaginal Bleeding     (Consider location/radiation/quality/duration/timing/severity/associated sxs/prior Treatment) HPI Phyllis Wilkins is a 33 y.o. female with a past medical history of endometriosis and polyps coming in with vaginal bleeding. Patient states this has been occurring for the last 8 consecutive days. He states his occurred to her before she is required blood transfusions. Last time was 6 months ago where she had heavy menses. She's always had heavy menses since she was a child, however sometimes it lasted longer than normal. She denies being on any birth control. She states she also has some left lower quadrant pain that feels like a contraction from pregnancy. Patient was supposed to follow up to have surgery but could not because [place] time. She's been nauseous without vomiting. She denies diarrhea, fevers or recent infections. She has no urinary complaints. Patient has no further complaints.  10 Systems reviewed and are negative for acute change except as noted in the HPI.     Past Medical History  Diagnosis Date  . Pancreatitis   . Endometriosis 2013  . Thyroid disease   . Endometrial polyp   . Anemia    Past Surgical History  Procedure Laterality Date  . Cholecystectomy    . Tubal ligation     Family History  Problem Relation Age of Onset  . Hypertension Father   . Diabetes Father   . Breast cancer Mother   . Hyperlipidemia Mother   . Hypertension Mother    History  Substance Use Topics  . Smoking status: Current Every Day Smoker -- 0.25 packs/day    Types: Cigarettes  . Smokeless tobacco: Not on file  . Alcohol Use: Yes     Comment: occas.   OB History   Grav Para Term Preterm Abortions TAB SAB Ect Mult Living   6 6 1 5      6      Review of  Systems    Allergies  Morphine and related  Home Medications   Prior to Admission medications   Medication Sig Start Date End Date Taking? Authorizing Provider  ibuprofen (ADVIL,MOTRIN) 200 MG tablet Take 400 mg by mouth every 6 (six) hours as needed for mild pain.   Yes Historical Provider, MD   BP 118/52  Pulse 66  Temp(Src) 98.7 F (37.1 C) (Oral)  Resp 16  Ht 5\' 11"  (1.803 m)  Wt 210 lb (95.255 kg)  BMI 29.30 kg/m2  SpO2 99%  LMP 02/28/2014 Physical Exam  Nursing note and vitals reviewed. Constitutional: She is oriented to person, place, and time. She appears well-developed and well-nourished. No distress.  HENT:  Head: Normocephalic and atraumatic.  Nose: Nose normal.  Mouth/Throat: Oropharynx is clear and moist. No oropharyngeal exudate.  Mildly pale conjunctiva noted.  Eyes: Conjunctivae and EOM are normal. Pupils are equal, round, and reactive to light. No scleral icterus.  Neck: Normal range of motion. Neck supple. No JVD present. No tracheal deviation present. No thyromegaly present.  Cardiovascular: Normal rate, regular rhythm and normal heart sounds.  Exam reveals no gallop and no friction rub.   No murmur heard. Pulmonary/Chest: Effort normal and breath sounds normal. No respiratory distress. She has no wheezes. She exhibits no tenderness.  Abdominal: Soft. Bowel sounds are normal. She exhibits no distension and  no mass. There is no tenderness. There is no rebound and no guarding.  Genitourinary:  Minute amount of blood seen in the vaginal canal. No source identified. No CMT. There is left adnexal tenderness greater than right. No discharge seen.  Musculoskeletal: Normal range of motion. She exhibits no edema and no tenderness.  Lymphadenopathy:    She has no cervical adenopathy.  Neurological: She is alert and oriented to person, place, and time. No cranial nerve deficit. She exhibits normal muscle tone.  Skin: Skin is warm and dry. No rash noted. She is not  diaphoretic. No erythema. No pallor.    ED Course  Procedures (including critical care time) Labs Review Labs Reviewed  WET PREP, GENITAL - Abnormal; Notable for the following:    Clue Cells Wet Prep HPF POC FEW (*)    WBC, Wet Prep HPF POC FEW (*)    All other components within normal limits  CBC WITH DIFFERENTIAL - Abnormal; Notable for the following:    Hemoglobin 8.3 (*)    HCT 28.4 (*)    MCV 72.1 (*)    MCH 21.1 (*)    MCHC 29.2 (*)    RDW 16.6 (*)    All other components within normal limits  COMPREHENSIVE METABOLIC PANEL - Abnormal; Notable for the following:    Potassium 3.4 (*)    Albumin 3.3 (*)    All other components within normal limits  GC/CHLAMYDIA PROBE AMP  LIPASE, BLOOD  URINALYSIS, ROUTINE W REFLEX MICROSCOPIC  PREGNANCY, URINE    Imaging Review US Transvaginal Non-ob  03/08/2014   CLINICAL DATA:  Dysfunctional uterine bleeding.  Pelvic pain.  EXAM: TRANSABDOMINAL AND TRANSVAGINAL ULTRASOUND OF PELVIS  TECHNIQUE: Both transabdominal and transvaginal ultrasound examinations of the pelvis were performed. Transabdominal technique was performed for global imaging of the pelvis including uterus, ovaries, adnexal regions, and pelvic cul-de-sac. It was necessary to proceed with endovaginal exam following the transabdominal exam to visualize the endometrium.  COMPARISON:  11/16/2012.  FINDINGS: Uterus  Measurements: 9.8 cm x 5.6 cm x 7.0 cm. No fibroids or other mass visualized.  Endometrium  Thickness: 15.4 mm.  No focal abnormality visualized.  Right ovary  Measurements: 28 mm x 24 mm x 20 mm. Normal appearance/no adnexal mass.  Left ovary  Measurements: 31 mm x 21 mm x 40 mm. Normal appearance/no adnexal mass.  Other findings  No free fluid.  IMPRESSION: Pelvic ultrasound within normal limits. If bleeding remains unresponsive to hormonal or medical therapy, sonohysterogram should be considered for focal lesion work-up. (Ref: Radiological Reasoning: Algorithmic Workup of  Abnormal Vaginal Bleeding with Endovaginal Sonography and Sonohysterography. AJR 2008; 161:W96-04)   Electronically Signed   By: Andreas Newport M.D.   On: 03/08/2014 07:42   US Pelvis Complete  03/08/2014   CLINICAL DATA:  Dysfunctional uterine bleeding.  Pelvic pain.  EXAM: TRANSABDOMINAL AND TRANSVAGINAL ULTRASOUND OF PELVIS  TECHNIQUE: Both transabdominal and transvaginal ultrasound examinations of the pelvis were performed. Transabdominal technique was performed for global imaging of the pelvis including uterus, ovaries, adnexal regions, and pelvic cul-de-sac. It was necessary to proceed with endovaginal exam following the transabdominal exam to visualize the endometrium.  COMPARISON:  11/16/2012.  FINDINGS: Uterus  Measurements: 9.8 cm x 5.6 cm x 7.0 cm. No fibroids or other mass visualized.  Endometrium  Thickness: 15.4 mm.  No focal abnormality visualized.  Right ovary  Measurements: 28 mm x 24 mm x 20 mm. Normal appearance/no adnexal mass.  Left ovary  Measurements: 31  mm x 21 mm x 40 mm. Normal appearance/no adnexal mass.  Other findings  No free fluid.  IMPRESSION: Pelvic ultrasound within normal limits. If bleeding remains unresponsive to hormonal or medical therapy, sonohysterogram should be considered for focal lesion work-up. (Ref: Radiological Reasoning: Algorithmic Workup of Abnormal Vaginal Bleeding with Endovaginal Sonography and Sonohysterography. AJR 2008; 960:A54-09)   Electronically Signed   By: Andreas Newport M.D.   On: 03/08/2014 07:42     EKG Interpretation None      MDM   Final diagnoses:  None    Patient presents emergency department out of concern for vaginal bleeding. Patient states she's had heavy menses and she was a child. This episode has occurred for 8 straight days. Her hemoglobin today is 8.3 and she did not require blood transfusion. Transvaginal ultrasound did reveal a focal lesion, but that was not seen today. Patient was strongly advised to followup with her  OB/GYN physician was also given discharge instructions to call and OB/GYN physician here. Return precautions were given for severe anemia. Her vital signs remained within her normal limits and she is safe for discharge.    Tomasita Crumble, MD 03/08/14 [phone]

## 2014-03-08 NOTE — ED Notes (Signed)
Patient with 8 day history of vaginal bleeding.  She states that she has had this before, has had to have blood transfusion for same.  She states that she has endometriosis and states that she is profusely bleeding and nauseated.

## 2014-03-08 NOTE — Discharge Instructions (Signed)
Abnormal Uterine Bleeding [name], you were seen today for vaginal bleeding.  Your blood count today was 8.3 which is not low enough to require a transfusion. It is very important for you to follow up with an OB/GYN physician as soon as possible for continued treatment of your bleeding.  You also have a lesion seen on ultrasound that needs to be evaluated.  Call the attached phone number for a clinic appointment.  If your symptoms worsen, or your develop shortness or breath, lightheadedness, come back to the ED immediately for repeat evaluation. Thank you. Abnormal uterine bleeding means bleeding from the vagina that is not your normal menstrual period. This can be:  Bleeding or spotting between periods.  Bleeding after sex (sexual intercourse).  Bleeding that is heavier or more than normal.  Periods that last longer than usual.  Bleeding after menopause. There are many problems that may cause this. Treatment will depend on the cause of the bleeding. Any kind of bleeding that is not normal should be reviewed by your doctor.  HOME CARE Watch your condition for any changes. These actions may lessen any discomfort you are having:  Do not use tampons or douches as told by your doctor.  Change your pads often. You should get regular pelvic exams and Pap tests. Keep all appointments for tests as told by your doctor. GET HELP IF:  You are bleeding for more than 1 week.  You feel dizzy at times. GET HELP RIGHT AWAY IF:   You pass out.  You have to change pads every 15 to 30 minutes.  You have belly pain.  You have a fever.  You become sweaty or weak.  You are passing large blood clots from the vagina.  You feel sick to your stomach (nauseous) and throw up (vomit). MAKE SURE YOU:  Understand these instructions.  Will watch your condition.  Will get help right away if you are not doing well or get worse. Document Released: 03/21/2009 Document Revised: 05/29/2013 Document  Reviewed: 12/21/2012 [facility] Patient Information 2015 [employer] [place]. This information is not intended to replace advice given to you by your health care provider. Make sure you discuss any questions you have with your health care provider.

## 2014-03-08 NOTE — ED Notes (Signed)
Informed RN of pt nausea

## 2014-03-09 LAB — GC/CHLAMYDIA PROBE AMP
CT Probe RNA: POSITIVE — AB
GC Probe RNA: NEGATIVE

## 2014-03-10 ENCOUNTER — Telehealth (HOSPITAL_BASED_OUTPATIENT_CLINIC_OR_DEPARTMENT_OTHER): Payer: Self-pay | Admitting: Emergency Medicine

## 2014-03-10 NOTE — Telephone Encounter (Signed)
Positive Chlamydia culture Chart sent to EDP for review 

## 2014-03-12 ENCOUNTER — Telehealth (HOSPITAL_BASED_OUTPATIENT_CLINIC_OR_DEPARTMENT_OTHER): Payer: Self-pay | Admitting: Emergency Medicine

## 2014-03-13 ENCOUNTER — Telehealth (HOSPITAL_COMMUNITY): Payer: Self-pay

## 2014-03-14 ENCOUNTER — Telehealth (HOSPITAL_COMMUNITY): Payer: Self-pay

## 2014-04-08 ENCOUNTER — Encounter (HOSPITAL_COMMUNITY): Payer: Self-pay | Admitting: Emergency Medicine

## 2014-04-24 ENCOUNTER — Telehealth: Payer: Self-pay | Admitting: *Deleted

## 2014-04-24 NOTE — Telephone Encounter (Addendum)
Pt left message stating that she really needs surgery and is having a lot of pain. Please call back.  I returned the call and left a message requesting her to call back and state whether a detailed message can be left on her voice mail.  **Pt needs to be informed that we do not have any appts available for surgical consult until January. We also cannot prescribe pain medication without an evaluation of her condition. If she feels she needs immediate evaluation she may go to [facility]U.

## 2014-04-24 NOTE — Telephone Encounter (Signed)
Patient returned call. Called patient and informed her that appointments are out until January-- advised she call back first week in December to schedule appointment. Message also sent to admin pool to schedule patient appointment. Advised patient to take ibuprofen for pain-- informed her she can take 800mg  TID if needed and advised that if pain is severe she come to [facility]U. Patient verbalized understanding and gratitude. No questions or concerns.

## 2014-06-17 ENCOUNTER — Ambulatory Visit: Payer: Self-pay | Admitting: Obstetrics & Gynecology

## 2014-08-21 ENCOUNTER — Encounter (HOSPITAL_COMMUNITY): Payer: Self-pay | Admitting: *Deleted

## 2014-08-21 ENCOUNTER — Observation Stay (HOSPITAL_COMMUNITY)
Admission: AD | Admit: 2014-08-21 | Discharge: 2014-08-23 | Disposition: A | Discharge status: Home / Self Care | Payer: Self-pay | Source: Ambulatory Visit | Attending: Obstetrics & Gynecology | Admitting: Obstetrics & Gynecology

## 2014-08-21 DIAGNOSIS — E079 Disorder of thyroid, unspecified: Secondary | ICD-10-CM | POA: Insufficient documentation

## 2014-08-21 DIAGNOSIS — A749 Chlamydial infection, unspecified: Secondary | ICD-10-CM | POA: Diagnosis present

## 2014-08-21 DIAGNOSIS — Z720 Tobacco use: Secondary | ICD-10-CM | POA: Insufficient documentation

## 2014-08-21 DIAGNOSIS — N809 Endometriosis, unspecified: Secondary | ICD-10-CM | POA: Insufficient documentation

## 2014-08-21 DIAGNOSIS — R51 Headache: Secondary | ICD-10-CM | POA: Insufficient documentation

## 2014-08-21 DIAGNOSIS — N730 Acute parametritis and pelvic cellulitis: Secondary | ICD-10-CM | POA: Diagnosis present

## 2014-08-21 DIAGNOSIS — Z3202 Encounter for pregnancy test, result negative: Secondary | ICD-10-CM | POA: Insufficient documentation

## 2014-08-21 DIAGNOSIS — D649 Anemia, unspecified: Secondary | ICD-10-CM | POA: Insufficient documentation

## 2014-08-21 DIAGNOSIS — N939 Abnormal uterine and vaginal bleeding, unspecified: Principal | ICD-10-CM | POA: Insufficient documentation

## 2014-08-21 DIAGNOSIS — N840 Polyp of corpus uteri: Secondary | ICD-10-CM | POA: Insufficient documentation

## 2014-08-21 DIAGNOSIS — Z9851 Tubal ligation status: Secondary | ICD-10-CM | POA: Insufficient documentation

## 2014-08-21 DIAGNOSIS — R42 Dizziness and giddiness: Secondary | ICD-10-CM | POA: Insufficient documentation

## 2014-08-21 DIAGNOSIS — K589 Irritable bowel syndrome without diarrhea: Secondary | ICD-10-CM | POA: Insufficient documentation

## 2014-08-21 DIAGNOSIS — D62 Acute posthemorrhagic anemia: Secondary | ICD-10-CM | POA: Diagnosis present

## 2014-08-21 DIAGNOSIS — R531 Weakness: Secondary | ICD-10-CM | POA: Insufficient documentation

## 2014-08-21 DIAGNOSIS — N920 Excessive and frequent menstruation with regular cycle: Secondary | ICD-10-CM | POA: Diagnosis present

## 2014-08-21 DIAGNOSIS — R109 Unspecified abdominal pain: Secondary | ICD-10-CM | POA: Insufficient documentation

## 2014-08-21 DIAGNOSIS — R110 Nausea: Secondary | ICD-10-CM | POA: Insufficient documentation

## 2014-08-21 DIAGNOSIS — R5383 Other fatigue: Secondary | ICD-10-CM | POA: Insufficient documentation

## 2014-08-21 DIAGNOSIS — H538 Other visual disturbances: Secondary | ICD-10-CM | POA: Insufficient documentation

## 2014-08-21 DIAGNOSIS — R079 Chest pain, unspecified: Secondary | ICD-10-CM | POA: Insufficient documentation

## 2014-08-21 DIAGNOSIS — R0602 Shortness of breath: Secondary | ICD-10-CM | POA: Insufficient documentation

## 2014-08-21 LAB — COMPREHENSIVE METABOLIC PANEL
ALBUMIN: 3.8 g/dL (ref 3.5–5.2)
ALK PHOS: 62 U/L (ref 39–117)
ALT: 14 U/L (ref 0–35)
ANION GAP: 5 (ref 5–15)
AST: 17 U/L (ref 0–37)
BILIRUBIN TOTAL: 0.8 mg/dL (ref 0.3–1.2)
BUN: 13 mg/dL (ref 6–23)
CHLORIDE: 106 mmol/L (ref 96–112)
CO2: 28 mmol/L (ref 19–32)
CREATININE: 0.8 mg/dL (ref 0.50–1.10)
Calcium: 8.6 mg/dL (ref 8.4–10.5)
GFR calc Af Amer: >90 mL/min (ref >90)
GFR calc non Af Amer: >90 mL/min (ref >90)
GLUCOSE: 91 mg/dL (ref 70–99)
Potassium: 3.9 mmol/L (ref 3.5–5.1)
SODIUM: 139 mmol/L (ref 135–145)
TOTAL PROTEIN: 6.8 g/dL (ref 6.0–8.3)

## 2014-08-21 LAB — CBC
HCT: 25.6 % — ABNORMAL LOW (ref 36.0–46.0)
Hemoglobin: 7.2 g/dL — ABNORMAL LOW (ref 12.0–15.0)
MCH: 19.3 pg — ABNORMAL LOW (ref 26.0–34.0)
MCHC: 28.1 g/dL — ABNORMAL LOW (ref 30.0–36.0)
MCV: 68.4 fL — ABNORMAL LOW (ref 78.0–100.0)
PLATELETS: 360 10*3/uL (ref 150–400)
RBC: 3.74 MIL/uL — AB (ref 3.87–5.11)
RDW: 16.9 % — ABNORMAL HIGH (ref 11.5–15.5)
WBC: 5.6 10*3/uL (ref 4.0–10.5)

## 2014-08-21 LAB — URINALYSIS, ROUTINE W REFLEX MICROSCOPIC
BILIRUBIN URINE: NEGATIVE
GLUCOSE, UA: NEGATIVE mg/dL
Ketones, ur: NEGATIVE mg/dL
Leukocytes, UA: NEGATIVE
Nitrite: NEGATIVE
PROTEIN: NEGATIVE mg/dL
SPECIFIC GRAVITY, URINE: 1.015 (ref 1.005–1.030)
UROBILINOGEN UA: 1 mg/dL (ref 0.0–1.0)
pH: 7 (ref 5.0–8.0)

## 2014-08-21 LAB — POCT PREGNANCY, URINE: PREG TEST UR: NEGATIVE

## 2014-08-21 LAB — URINE MICROSCOPIC-ADD ON

## 2014-08-21 MED ORDER — ASPIRIN 325 MG PO TABS: 325.0000 mg | ORAL_TABLET | Freq: Every day | ORAL | Status: DC

## 2014-08-21 MED ORDER — ONDANSETRON HCL 4 MG/2ML IJ SOLN
4.0000 mg | Freq: Once | INTRAMUSCULAR | Status: AC
  Administered 2014-08-22: 4 mg via INTRAVENOUS
  Filled 2014-08-21: qty 2

## 2014-08-21 MED ORDER — ASPIRIN 81 MG PO CHEW
324.0000 mg | CHEWABLE_TABLET | Freq: Once | ORAL | Status: AC
  Administered 2014-08-21: 324 mg via ORAL
  Filled 2014-08-21: qty 4

## 2014-08-21 MED ORDER — SODIUM CHLORIDE 0.9 % IV BOLUS (SEPSIS)
1000.0000 mL | Freq: Once | INTRAVENOUS | Status: AC
  Administered 2014-08-21: 1000 mL via INTRAVENOUS

## 2014-08-21 NOTE — MAU Note (Signed)
Pt complaining of chest pain on left side, SOB. Head pressure and blurry vision since 7pm today.  Heavy bleeding since yesterday. Changes pads almost every hour.

## 2014-08-21 NOTE — MAU Provider Note (Signed)
Chief Complaint: No chief complaint on file.   First Provider Initiated Contact with Patient 08/21/14 2229     SUBJECTIVE HPI: Phyllis Wilkins is a 34 y.o. [phone] female who presents with multiple complaints. 1. Heavy vaginal bleeding since yesterday. Soaking 1 pad per hour. Earlier than normal Menstrual period. 2. Chest pain, shortness of breath and blurry vision since 7 PM. 3. Worsening dizziness throughout the day. 4. Abdominal cramping since yesterday. 5. Painful varicose veins on the back of her right lower thigh.  Rates abdominal pain and chest pain 8/10 on pain scale. Hasn't tried anything for the pain. No history of cardiac or pulmonary disease.  Long history of menorrhagia. Has required transfusion in the past. Menorrhagia not controlled with Depo-Provera, Mirena or Provera. [facility]. Missed her preop appointment in January for endometrial ablation. Last pelvic ultrasound October 2015 normal.  Was seen in the ED October 2015 and diagnosed with Chlamydia, but states she was never informed and therefore never treated.  Past Medical History  Diagnosis Date  . Pancreatitis   . Endometriosis 2013  . Thyroid disease   . Endometrial polyp   . Anemia    OB History  Gravida Para Term Preterm AB SAB TAB Ectopic Multiple Living  6 6 1 5      6     # Outcome Date GA Lbr Len/2nd Weight Sex Delivery Anes PTL Lv  6 Preterm           5 Preterm           4 Preterm           3 Preterm           2 Preterm           1 Term              Past Surgical History  Procedure Laterality Date  . Cholecystectomy    . Tubal ligation     History   Social History  . Marital Status: Single    Spouse Name: N/A  . Number of Children: N/A  . Years of Education: N/A   Occupational History  . Not on file.   Social History Main Topics  . Smoking status: Current Every Day Smoker -- 0.25 packs/day    Types: Cigarettes  . Smokeless tobacco: Not on file   . Alcohol Use: Yes     Comment: occas.  . Drug Use: No  . Sexual Activity: No   Other Topics Concern  . Not on file   Social History Narrative   No current facility-administered medications on file prior to encounter.   Current Outpatient Prescriptions on File Prior to Encounter  Medication Sig Dispense Refill  . HYDROcodone-acetaminophen (NORCO/VICODIN) 5-325 MG per tablet Take 1 tablet by mouth 2 (two) times daily as needed for severe pain. 10 tablet 0  . ibuprofen (ADVIL,MOTRIN) 200 MG tablet Take 400 mg by mouth every 6 (six) hours as needed for mild pain.     No Known Allergies  Review of Systems  Constitutional: Positive for malaise/fatigue. Negative for fever and chills.  HENT: Negative for congestion and sore throat.   Respiratory: Positive for shortness of breath. Negative for cough, hemoptysis, sputum production and wheezing.   Cardiovascular: Positive for chest pain. Negative for palpitations and leg swelling.  Gastrointestinal: Positive for nausea and abdominal pain. Negative for vomiting, diarrhea, constipation and blood in stool.  Genitourinary: Negative for dysuria, urgency, frequency, hematuria and  flank pain.       Positive for vaginal bleeding. Negative for vaginal discharge or vaginal odor.  Musculoskeletal: Negative for myalgias, falls and neck pain.  Neurological: Positive for dizziness, weakness and headaches.  Endo/Heme/Allergies: Does not bruise/bleed easily.   OBJECTIVE Blood pressure 132/66, pulse 89, temperature 99.8 F (37.7 C), temperature source Oral, resp. rate 24, height  (1.803 m), weight 93.078 kg (205 lb 3.2 oz), last menstrual period 08/16/2014.  Patient Vitals for the past 24 hrs:  BP Temp Temp src Pulse Resp SpO2 Height Weight  08/21/14 2315 - 99.8 F (37.7 C) Oral - - - - -  08/21/14 2208 - - - - - -  (1.803 m) 93.078 kg (205 lb 3.2 oz)  08/21/14 2204 132/66 mmHg 100 F (37.8 C) Oral 89 24 - - -   GENERAL: Well-developed,  well-nourished female in moderate distress. Pale. HEENT: Normocephalic HEART: normal rate and rhythm. No murmurs rubs or gallops. RESP: normal effort. Clear to auscultation bilaterally. ABDOMEN: Soft, bilateral low abdominal tenderness and guarding. Negative for rebound tenderness or mass. Positive bowel sounds 4. Negative CVA tenderness. EXTREMITIES: Nontender, no edema. Varicose veins noted on back of right thigh. No areas of erythema, swelling or tenderness. NEURO: Alert and oriented SPECULUM EXAM: NEFG, moderate amount of bright red blood and small clots in vaginal vault, cervix clean. 2 cm cyst on anterior lip of cervix.  BIMANUAL: cervix closed; uterus normal size, no adnexal tenderness or masses. Positive cervical motion tenderness  LAB RESULTS Results for orders placed or performed during the hospital encounter of 08/21/14 (from the past 24 hour(s))  Urinalysis, Routine w reflex microscopic     Status: Abnormal   Collection Time: 08/21/14 10:00 PM  Result Value Ref Range   Color, Urine YELLOW YELLOW   APPearance CLEAR CLEAR   Specific Gravity, Urine 1.015 1.005 - 1.030   pH 7.0 5.0 - 8.0   Glucose, UA NEGATIVE NEGATIVE mg/dL   Hgb urine dipstick MODERATE (A) NEGATIVE   Bilirubin Urine NEGATIVE NEGATIVE   Ketones, ur NEGATIVE NEGATIVE mg/dL   Protein, ur NEGATIVE NEGATIVE mg/dL   Urobilinogen, UA 1.0 0.0 - 1.0 mg/dL   Nitrite NEGATIVE NEGATIVE   Leukocytes, UA NEGATIVE NEGATIVE  Urine microscopic-add on     Status: Abnormal   Collection Time: 08/21/14 10:00 PM  Result Value Ref Range   Squamous Epithelial / LPF RARE RARE   WBC, UA 0-2 <3 WBC/hpf   RBC / HPF 7-10 <3 RBC/hpf   Bacteria, UA FEW (A) RARE   Urine-Other AMORPHOUS URATES/PHOSPHATES   Pregnancy, urine POC     Status: None   Collection Time: 08/21/14 10:02 PM  Result Value Ref Range   Preg Test, Ur NEGATIVE NEGATIVE  CBC     Status: Abnormal   Collection Time: 08/21/14 10:53 PM  Result Value Ref Range   WBC  5.6 4.0 - 10.5 K/uL   RBC 3.74 (L) 3.87 - 5.11 MIL/uL   Hemoglobin 7.2 (L) 12.0 - 15.0 g/dL   HCT 14.7 (L) 82.9 - 56.2 %   MCV 68.4 (L) 78.0 - 100.0 fL   MCH 19.3 (L) 26.0 - 34.0 pg   MCHC 28.1 (L) 30.0 - 36.0 g/dL   RDW 13.0 (H) 86.5 - 78.4 %   Platelets 360 150 - 400 K/uL  Comprehensive metabolic panel     Status: None   Collection Time: 08/21/14 10:53 PM  Result Value Ref Range   Sodium 139 135 - 145  mmol/L   Potassium 3.9 3.5 - 5.1 mmol/L   Chloride 106 96 - 112 mmol/L   CO2 28 19 - 32 mmol/L   Glucose, Bld 91 70 - 99 mg/dL   BUN 13 6 - 23 mg/dL   Creatinine, Ser 1.61 0.50 - 1.10 mg/dL   Calcium 8.6 8.4 - 09.6 mg/dL   Total Protein 6.8 6.0 - 8.3 g/dL   Albumin 3.8 3.5 - 5.2 g/dL   AST 17 0 - 37 U/L   ALT 14 0 - 35 U/L   Alkaline Phosphatase 62 39 - 117 U/L   Total Bilirubin 0.8 0.3 - 1.2 mg/dL   GFR calc non Af Amer >90 >90 mL/min   GFR calc Af Amer >90 >90 mL/min   Anion gap 5 5 - 15  Type and screen     Status: None (Preliminary result)   Collection Time: 08/21/14 10:53 PM  Result Value Ref Range   ABO/RH(D) O POS    Antibody Screen NEG    Sample Expiration 08/24/2014    Unit Number [id number]    Blood Component Type RED CELLS,LR    Unit division 00    Status of Unit ISSUED    Transfusion Status OK TO TRANSFUSE    Crossmatch Result Compatible    Unit Number [id number]    Blood Component Type RED CELLS,LR    Unit division 00    Status of Unit ALLOCATED    Transfusion Status OK TO TRANSFUSE    Crossmatch Result Compatible   Troponin I     Status: None   Collection Time: 08/21/14 10:53 PM  Result Value Ref Range   Troponin I <0.03 <0.031 ng/mL  Wet prep, genital     Status: Abnormal   Collection Time: 08/22/14 12:25 AM  Result Value Ref Range   Yeast Wet Prep HPF POC NONE SEEN NONE SEEN   Trich, Wet Prep NONE SEEN NONE SEEN   Clue Cells Wet Prep HPF POC FEW (A) NONE SEEN   WBC, Wet Prep HPF POC NONE SEEN NONE SEEN  Prepare RBC     Status: None    Collection Time: 08/22/14  1:15 AM  Result Value Ref Range   Order Confirmation ORDER PROCESSED BY BLOOD BANK     IMAGING N/A  MAU COURSE EKG shows normal sinus rhythm. CBC, CMP, troponin, aspirin, normal saline fluid bolus. Patient's symptoms improving with fluid bolus.  Discussed presenting complaints, severe anemia and results of EKG and labs with Dr. Despina Hidden.   ASSESSMENT Symptomatic Anemia of acute blood loss Untreated Chlamydia Possible PID  PLAN Admit to third floor for blood transfusion Rocephin and doxycycline Gonorrhea and Chlamydia cultures pending.  [place], CNM 08/22/2014  3:06 AM

## 2014-08-22 ENCOUNTER — Encounter (HOSPITAL_COMMUNITY): Payer: Self-pay | Admitting: *Deleted

## 2014-08-22 DIAGNOSIS — N920 Excessive and frequent menstruation with regular cycle: Secondary | ICD-10-CM | POA: Diagnosis present

## 2014-08-22 DIAGNOSIS — D62 Acute posthemorrhagic anemia: Secondary | ICD-10-CM | POA: Diagnosis present

## 2014-08-22 LAB — WET PREP, GENITAL
TRICH WET PREP: NONE SEEN
WBC WET PREP: NONE SEEN
Yeast Wet Prep HPF POC: NONE SEEN

## 2014-08-22 LAB — TROPONIN I: Troponin I: <0.03 ng/mL (ref <0.031)

## 2014-08-22 LAB — PREPARE RBC (CROSSMATCH)

## 2014-08-22 LAB — GC/CHLAMYDIA PROBE AMP (~~LOC~~) NOT AT ARMC
Chlamydia: POSITIVE — AB
Neisseria Gonorrhea: NEGATIVE

## 2014-08-22 LAB — RPR: RPR: NONREACTIVE

## 2014-08-22 MED ORDER — DIPHENHYDRAMINE HCL 25 MG PO CAPS
25.0000 mg | ORAL_CAPSULE | Freq: Once | ORAL | Status: AC
  Administered 2014-08-22: 25 mg via ORAL
  Filled 2014-08-22: qty 1

## 2014-08-22 MED ORDER — ONDANSETRON HCL 4 MG PO TABS
8.0000 mg | ORAL_TABLET | Freq: Four times a day (QID) | ORAL | Status: DC | PRN
  Administered 2014-08-22: 8 mg via ORAL
  Filled 2014-08-22: qty 2

## 2014-08-22 MED ORDER — HYDROMORPHONE HCL 1 MG/ML IJ SOLN
1.0000 mg | INTRAMUSCULAR | Status: DC | PRN
  Administered 2014-08-22: 1 mg via INTRAVENOUS
  Filled 2014-08-22: qty 1

## 2014-08-22 MED ORDER — SODIUM CHLORIDE 0.9 % IV SOLN
510.0000 mg | Freq: Once | INTRAVENOUS | Status: AC
  Administered 2014-08-22: 510 mg via INTRAVENOUS
  Filled 2014-08-22: qty 17

## 2014-08-22 MED ORDER — DOXYCYCLINE HYCLATE 100 MG PO TABS
100.0000 mg | ORAL_TABLET | Freq: Two times a day (BID) | ORAL | Status: DC
  Administered 2014-08-22 (×3): 100 mg via ORAL
  Filled 2014-08-22 (×4): qty 1

## 2014-08-22 MED ORDER — DEXTROSE-NACL 5-0.45 % IV SOLN
INTRAVENOUS | Status: DC
  Administered 2014-08-22: 75 mL/h via INTRAVENOUS
  Administered 2014-08-22: 20:00:00 via INTRAVENOUS

## 2014-08-22 MED ORDER — ESTROGENS CONJUGATED 25 MG IJ SOLR
25.0000 mg | INTRAMUSCULAR | Status: AC
  Administered 2014-08-22 (×2): 25 mg via INTRAVENOUS
  Filled 2014-08-22 (×2): qty 25

## 2014-08-22 MED ORDER — SODIUM CHLORIDE 0.9 % IV SOLN
Freq: Once | INTRAVENOUS | Status: AC
  Administered 2014-08-22: 10 mL/h via INTRAVENOUS

## 2014-08-22 MED ORDER — CEFTRIAXONE SODIUM IN DEXTROSE 20 MG/ML IV SOLN
1.0000 g | Freq: Two times a day (BID) | INTRAVENOUS | Status: DC
  Administered 2014-08-22: 1 g via INTRAVENOUS
  Filled 2014-08-22: qty 50

## 2014-08-22 MED ORDER — CEFTRIAXONE SODIUM IN DEXTROSE 20 MG/ML IV SOLN
1.0000 g | Freq: Every day | INTRAVENOUS | Status: DC
  Administered 2014-08-22: 1 g via INTRAVENOUS
  Filled 2014-08-22 (×2): qty 50

## 2014-08-22 MED ORDER — ACETAMINOPHEN 325 MG PO TABS
650.0000 mg | ORAL_TABLET | Freq: Once | ORAL | Status: AC
  Administered 2014-08-22: 650 mg via ORAL
  Filled 2014-08-22: qty 2

## 2014-08-22 MED ORDER — OXYCODONE HCL 5 MG PO TABS
5.0000 mg | ORAL_TABLET | ORAL | Status: DC | PRN
  Administered 2014-08-22 – 2014-08-23 (×2): 5 mg via ORAL
  Filled 2014-08-22 (×2): qty 1

## 2014-08-22 MED ORDER — MEGESTROL ACETATE 40 MG PO TABS
120.0000 mg | ORAL_TABLET | Freq: Every day | ORAL | Status: DC
  Administered 2014-08-22: 120 mg via ORAL
  Filled 2014-08-22 (×2): qty 3

## 2014-08-22 MED ORDER — ESTROGENS CONJUGATED 25 MG IJ SOLR
25.0000 mg | Freq: Once | INTRAMUSCULAR | Status: AC
  Administered 2014-08-22: 25 mg via INTRAVENOUS
  Filled 2014-08-22: qty 25

## 2014-08-22 MED ORDER — ZOLPIDEM TARTRATE 5 MG PO TABS
5.0000 mg | ORAL_TABLET | Freq: Every evening | ORAL | Status: DC | PRN
  Administered 2014-08-22 (×2): 5 mg via ORAL
  Filled 2014-08-22 (×2): qty 1

## 2014-08-22 MED ORDER — GI COCKTAIL ~~LOC~~
30.0000 mL | Freq: Once | ORAL | Status: AC
  Administered 2014-08-22: 30 mL via ORAL
  Filled 2014-08-22: qty 30

## 2014-08-22 MED ORDER — FUROSEMIDE 10 MG/ML IJ SOLN
20.0000 mg | Freq: Once | INTRAMUSCULAR | Status: AC
  Administered 2014-08-22: 20 mg via INTRAVENOUS
  Filled 2014-08-22: qty 2

## 2014-08-22 MED ORDER — DEXTROSE IN LACTATED RINGERS 5 % IV SOLN: INTRAVENOUS | Status: DC

## 2014-08-22 MED ORDER — ONDANSETRON 8 MG/NS 50 ML IVPB
8.0000 mg | Freq: Four times a day (QID) | INTRAVENOUS | Status: DC | PRN
  Administered 2014-08-22: 8 mg via INTRAVENOUS
  Filled 2014-08-22 (×2): qty 8

## 2014-08-22 NOTE — H&P (Signed)
Chief Complaint: No chief complaint on file.   First Provider Initiated Contact with Patient 08/21/14 2229     SUBJECTIVE HPI: Phyllis Wilkins is a 33 y.o. [id number] female who presents with multiple complaints. 1. Heavy vaginal bleeding since yesterday. Soaking 1 pad per hour. Earlier than normal Menstrual period. 2. Chest pain, shortness of breath and blurry vision since 7 PM. 3. Worsening dizziness throughout the day. 4. Abdominal cramping since yesterday. 5. Painful varicose veins on the back of her right lower thigh.  Rates abdominal pain and chest pain 8/10 on pain scale. Hasn't tried anything for the pain. No history of cardiac or pulmonary disease.  Long history of menorrhagia. Has required transfusion in the past. Menorrhagia not controlled with Depo-Provera, Mirena or Provera. [facility] Missed her preop appointment in January for endometrial ablation. Last pelvic ultrasound October 2015 normal.  Was seen in the ED October 2015 and diagnosed with Chlamydia, but states she was never informed and therefore never treated.  Past Medical History  Diagnosis Date  . Pancreatitis   . Endometriosis 2013  . Thyroid disease   . Endometrial polyp   . Anemia    OB History  Gravida Para Term Preterm AB SAB TAB Ectopic Multiple Living  6 6 1 5      6    # Outcome Date GA Lbr Len/2nd Weight Sex Delivery Anes PTL Lv  6 Preterm           5 Preterm           4 Preterm           3 Preterm           2 Preterm           1 Term              Past Surgical History  Procedure Laterality Date  . Cholecystectomy    . Tubal ligation     History   Social History  . Marital Status: Single    Spouse Name: N/A  . Number of Children: N/A  . Years of Education: N/A   Occupational History  . Not on file.   Social History Main Topics  . Smoking status: Current Every Day Smoker -- 0.25 packs/day    Types: Cigarettes  . Smokeless tobacco: Not on file   . Alcohol Use: Yes     Comment: occas.  . Drug Use: No  . Sexual Activity: No   Other Topics Concern  . Not on file   Social History Narrative   No current facility-administered medications on file prior to encounter.   Current Outpatient Prescriptions on File Prior to Encounter  Medication Sig Dispense Refill  . HYDROcodone-acetaminophen (NORCO/VICODIN) 5-325 MG per tablet Take 1 tablet by mouth 2 (two) times daily as needed for severe pain. 10 tablet 0  . ibuprofen (ADVIL,MOTRIN) 200 MG tablet Take 400 mg by mouth every 6 (six) hours as needed for mild pain.     No Known Allergies  Review of Systems  Constitutional: Positive for malaise/fatigue. Negative for fever and chills.  HENT: Negative for congestion and sore throat.   Respiratory: Positive for shortness of breath. Negative for cough, hemoptysis, sputum production and wheezing.   Cardiovascular: Positive for chest pain. Negative for palpitations and leg swelling.  Gastrointestinal: Positive for nausea and abdominal pain. Negative for vomiting, diarrhea, constipation and blood in stool.  Genitourinary: Negative for dysuria, urgency, frequency, hematuria and   flank pain.       Positive for vaginal bleeding. Negative for vaginal discharge or vaginal odor.  Musculoskeletal: Negative for myalgias, falls and neck pain.  Neurological: Positive for dizziness, weakness and headaches.  Endo/Heme/Allergies: Does not bruise/bleed easily.   OBJECTIVE Blood pressure 132/66, pulse 89, temperature 99.8 F (37.7 C), temperature source Oral, resp. rate 24, height 5' 11" (1.803 m), weight 93.078 kg (205 lb 3.2 oz), last menstrual period 08/16/2014.  Patient Vitals for the past 24 hrs:  BP Temp Temp src Pulse Resp SpO2 Height Weight  08/21/14 2315 - 99.8 F (37.7 C) Oral - - - - -  08/21/14 2208 - - - - - - 5' 11" (1.803 m) 93.078 kg (205 lb 3.2 oz)  08/21/14 2204 132/66 mmHg 100 F (37.8 C) Oral 89 24 - - -   GENERAL: Well-developed,  well-nourished female in moderate distress. Pale. HEENT: Normocephalic HEART: normal rate and rhythm. No murmurs rubs or gallops. RESP: normal effort. Clear to auscultation bilaterally. ABDOMEN: Soft, bilateral low abdominal tenderness and guarding. Negative for rebound tenderness or mass. Positive bowel sounds 4. Negative CVA tenderness. EXTREMITIES: Nontender, no edema. Varicose veins noted on back of right thigh. No areas of erythema, swelling or tenderness. NEURO: Alert and oriented SPECULUM EXAM: NEFG, moderate amount of bright red blood and small clots in vaginal vault, cervix clean. 2 cm cyst on anterior lip of cervix.  BIMANUAL: cervix closed; uterus normal size, no adnexal tenderness or masses. Positive cervical motion tenderness  LAB RESULTS Results for orders placed or performed during the hospital encounter of 08/21/14 (from the past 24 hour(s))  Urinalysis, Routine w reflex microscopic     Status: Abnormal   Collection Time: 08/21/14 10:00 PM  Result Value Ref Range   Color, Urine YELLOW YELLOW   APPearance CLEAR CLEAR   Specific Gravity, Urine 1.015 1.005 - 1.030   pH 7.0 5.0 - 8.0   Glucose, UA NEGATIVE NEGATIVE mg/dL   Hgb urine dipstick MODERATE (A) NEGATIVE   Bilirubin Urine NEGATIVE NEGATIVE   Ketones, ur NEGATIVE NEGATIVE mg/dL   Protein, ur NEGATIVE NEGATIVE mg/dL   Urobilinogen, UA 1.0 0.0 - 1.0 mg/dL   Nitrite NEGATIVE NEGATIVE   Leukocytes, UA NEGATIVE NEGATIVE  Urine microscopic-add on     Status: Abnormal   Collection Time: 08/21/14 10:00 PM  Result Value Ref Range   Squamous Epithelial / LPF RARE RARE   WBC, UA 0-2 <3 WBC/hpf   RBC / HPF 7-10 <3 RBC/hpf   Bacteria, UA FEW (A) RARE   Urine-Other AMORPHOUS URATES/PHOSPHATES   Pregnancy, urine POC     Status: None   Collection Time: 08/21/14 10:02 PM  Result Value Ref Range   Preg Test, Ur NEGATIVE NEGATIVE  CBC     Status: Abnormal   Collection Time: 08/21/14 10:53 PM  Result Value Ref Range   WBC  5.6 4.0 - 10.5 K/uL   RBC 3.74 (L) 3.87 - 5.11 MIL/uL   Hemoglobin 7.2 (L) 12.0 - 15.0 g/dL   HCT 25.6 (L) 36.0 - 46.0 %   MCV 68.4 (L) 78.0 - 100.0 fL   MCH 19.3 (L) 26.0 - 34.0 pg   MCHC 28.1 (L) 30.0 - 36.0 g/dL   RDW 16.9 (H) 11.5 - 15.5 %   Platelets 360 150 - 400 K/uL  Comprehensive metabolic panel     Status: None   Collection Time: 08/21/14 10:53 PM  Result Value Ref Range   Sodium 139 135 - 145   mmol/L   Potassium 3.9 3.5 - 5.1 mmol/L   Chloride 106 96 - 112 mmol/L   CO2 28 19 - 32 mmol/L   Glucose, Bld 91 70 - 99 mg/dL   BUN 13 6 - 23 mg/dL   Creatinine, Ser 0.80 0.50 - 1.10 mg/dL   Calcium 8.6 8.4 - 10.5 mg/dL   Total Protein 6.8 6.0 - 8.3 g/dL   Albumin 3.8 3.5 - 5.2 g/dL   AST 17 0 - 37 U/L   ALT 14 0 - 35 U/L   Alkaline Phosphatase 62 39 - 117 U/L   Total Bilirubin 0.8 0.3 - 1.2 mg/dL   GFR calc non Af Amer >90 >90 mL/min   GFR calc Af Amer >90 >90 mL/min   Anion gap 5 5 - 15  Type and screen     Status: None (Preliminary result)   Collection Time: 08/21/14 10:53 PM  Result Value Ref Range   ABO/RH(D) O POS    Antibody Screen NEG    Sample Expiration 08/24/2014    Unit Number [id number]    Blood Component Type RED CELLS,LR    Unit division 00    Status of Unit ISSUED    Transfusion Status OK TO TRANSFUSE    Crossmatch Result Compatible    Unit Number [id number]    Blood Component Type RED CELLS,LR    Unit division 00    Status of Unit ALLOCATED    Transfusion Status OK TO TRANSFUSE    Crossmatch Result Compatible   Troponin I     Status: None   Collection Time: 08/21/14 10:53 PM  Result Value Ref Range   Troponin I <0.03 <0.031 ng/mL  Wet prep, genital     Status: Abnormal   Collection Time: 08/22/14 12:25 AM  Result Value Ref Range   Yeast Wet Prep HPF POC NONE SEEN NONE SEEN   Trich, Wet Prep NONE SEEN NONE SEEN   Clue Cells Wet Prep HPF POC FEW (A) NONE SEEN   WBC, Wet Prep HPF POC NONE SEEN NONE SEEN  Prepare RBC     Status: None    Collection Time: 08/22/14  1:15 AM  Result Value Ref Range   Order Confirmation ORDER PROCESSED BY BLOOD BANK     IMAGING N/A  MAU COURSE EKG shows normal sinus rhythm. CBC, CMP, troponin, aspirin, normal saline fluid bolus. Patient's symptoms improving with fluid bolus.  Discussed presenting complaints, severe anemia and results of EKG and labs with Dr. Eure.   ASSESSMENT Symptomatic Anemia of acute blood loss Untreated Chlamydia Possible PID  PLAN Admit to third floor for blood transfusion Rocephin and doxycycline Gonorrhea and Chlamydia cultures pending.  Jeanee Fabre, CNM 08/22/2014  3:06 AM     

## 2014-08-22 NOTE — Plan of Care (Signed)
Problem: Phase II Progression Outcomes Goal: Progress activity as tolerated unless otherwise ordered Outcome: Progressing Pt slow to move   Needs encouragement

## 2014-08-22 NOTE — Progress Notes (Signed)
Patient ID: Phyllis BameDebrah Eudy, female   DOB: 07/02/1980, 34 y.o.   MRN: [id number] See H&P  1.  Menorrhagia with symptomatic anemia:  S/P 2 units PRBC, s/p Iv premarin x 3, s/p megestrol 120 mg x 1, post transfusion H&H 9/27, IV feraheme also given   2.  Dysmenorrhea  3. Untreated chlamydia: Rocephin and Doxy, probably home on Doxycycline to complete 10 days course  Long term management:  Will need to stabilize endoemtrium with higher dose preogesterone in the short term, 80 -120 mg megace daily then transition, Pt missed her pre op appt for a scheduled endometrial ablation in January.  Other options include LNG-IUD  Will evalaute for discharge today

## 2014-08-22 NOTE — H&P (Signed)
Chief Complaint: No chief complaint on file.   First Provider Initiated Contact with Patient 08/21/14 2229     SUBJECTIVE HPI: Phyllis BameDebrah Wilkins is a 34 y.o. [phone] female who presents with multiple complaints. 1. Heavy vaginal bleeding since yesterday. Soaking 1 pad per hour. Earlier than normal Menstrual period. 2. Chest pain, shortness of breath and blurry vision since 7 PM. 3. Worsening dizziness throughout the day. 4. Abdominal cramping since yesterday. 5. Painful varicose veins on the back of her right lower thigh.  Rates abdominal pain and chest pain 8/10 on pain scale. Hasn't tried anything for the pain. No history of cardiac or pulmonary disease.  Long history of menorrhagia. Has required transfusion in the past. Menorrhagia not controlled with Depo-Provera, Mirena or Provera. Is a patient at [facility]. Missed her preop appointment in January for endometrial ablation. Last pelvic ultrasound October 2015 normal.  Was seen in the ED October 2015 and diagnosed with Chlamydia, but states she was never informed and therefore never treated.  Past Medical History  Diagnosis Date  . Pancreatitis   . Endometriosis 2013  . Thyroid disease   . Endometrial polyp   . Anemia    OB History  Gravida Para Term Preterm AB SAB TAB Ectopic Multiple Living  6 6 1 5      6     # Outcome Date GA Lbr Len/2nd Weight Sex Delivery Anes PTL Lv  6 Preterm           5 Preterm           4 Preterm           3 Preterm           2 Preterm           1 Term              Past Surgical History  Procedure Laterality Date  . Cholecystectomy    . Tubal ligation     History   Social History  . Marital Status: Single    Spouse Name: N/A  . Number of Children: N/A  . Years of Education: N/A   Occupational History  . Not on file.   Social History Main Topics  . Smoking status: Current Every Day Smoker -- 0.25 packs/day    Types: Cigarettes  . Smokeless tobacco: Not on file   . Alcohol Use: Yes     Comment: occas.  . Drug Use: No  . Sexual Activity: No   Other Topics Concern  . Not on file   Social History Narrative   No current facility-administered medications on file prior to encounter.   Current Outpatient Prescriptions on File Prior to Encounter  Medication Sig Dispense Refill  . HYDROcodone-acetaminophen (NORCO/VICODIN) 5-325 MG per tablet Take 1 tablet by mouth 2 (two) times daily as needed for severe pain. 10 tablet 0  . ibuprofen (ADVIL,MOTRIN) 200 MG tablet Take 400 mg by mouth every 6 (six) hours as needed for mild pain.     No Known Allergies  Review of Systems  Constitutional: Positive for malaise/fatigue. Negative for fever and chills.  HENT: Negative for congestion and sore throat.   Respiratory: Positive for shortness of breath. Negative for cough, hemoptysis, sputum production and wheezing.   Cardiovascular: Positive for chest pain. Negative for palpitations and leg swelling.  Gastrointestinal: Positive for nausea and abdominal pain. Negative for vomiting, diarrhea, constipation and blood in stool.  Genitourinary: Negative for dysuria, urgency, frequency, hematuria and  flank pain.       Positive for vaginal bleeding. Negative for vaginal discharge or vaginal odor.  Musculoskeletal: Negative for myalgias, falls and neck pain.  Neurological: Positive for dizziness, weakness and headaches.  Endo/Heme/Allergies: Does not bruise/bleed easily.   OBJECTIVE Blood pressure 132/66, pulse 89, temperature 99.8 F (37.7 C), temperature source Oral, resp. rate 24, height  (1.803 m), weight 93.078 kg (205 lb 3.2 oz), last menstrual period 08/16/2014.  Patient Vitals for the past 24 hrs:  BP Temp Temp src Pulse Resp SpO2 Height Weight  08/21/14 2315 - 99.8 F (37.7 C) Oral - - - - -  08/21/14 2208 - - - - - -  (1.803 m) 93.078 kg (205 lb 3.2 oz)  08/21/14 2204 132/66 mmHg 100 F (37.8 C) Oral 89 24 - - -   GENERAL: Well-developed,  well-nourished female in moderate distress. Pale. HEENT: Normocephalic HEART: normal rate and rhythm. No murmurs rubs or gallops. RESP: normal effort. Clear to auscultation bilaterally. ABDOMEN: Soft, bilateral low abdominal tenderness and guarding. Negative for rebound tenderness or mass. Positive bowel sounds 4. Negative CVA tenderness. EXTREMITIES: Nontender, no edema. Varicose veins noted on back of right thigh. No areas of erythema, swelling or tenderness. NEURO: Alert and oriented SPECULUM EXAM: NEFG, moderate amount of bright red blood and small clots in vaginal vault, cervix clean. 2 cm cyst on anterior lip of cervix.  BIMANUAL: cervix closed; uterus normal size, no adnexal tenderness or masses. Positive cervical motion tenderness  LAB RESULTS Results for orders placed or performed during the hospital encounter of 08/21/14 (from the past 24 hour(s))  Urinalysis, Routine w reflex microscopic     Status: Abnormal   Collection Time: 08/21/14 10:00 PM  Result Value Ref Range   Color, Urine YELLOW YELLOW   APPearance CLEAR CLEAR   Specific Gravity, Urine 1.015 1.005 - 1.030   pH 7.0 5.0 - 8.0   Glucose, UA NEGATIVE NEGATIVE mg/dL   Hgb urine dipstick MODERATE (A) NEGATIVE   Bilirubin Urine NEGATIVE NEGATIVE   Ketones, ur NEGATIVE NEGATIVE mg/dL   Protein, ur NEGATIVE NEGATIVE mg/dL   Urobilinogen, UA 1.0 0.0 - 1.0 mg/dL   Nitrite NEGATIVE NEGATIVE   Leukocytes, UA NEGATIVE NEGATIVE  Urine microscopic-add on     Status: Abnormal   Collection Time: 08/21/14 10:00 PM  Result Value Ref Range   Squamous Epithelial / LPF RARE RARE   WBC, UA 0-2 <3 WBC/hpf   RBC / HPF 7-10 <3 RBC/hpf   Bacteria, UA FEW (A) RARE   Urine-Other AMORPHOUS URATES/PHOSPHATES   Pregnancy, urine POC     Status: None   Collection Time: 08/21/14 10:02 PM  Result Value Ref Range   Preg Test, Ur NEGATIVE NEGATIVE  CBC     Status: Abnormal   Collection Time: 08/21/14 10:53 PM  Result Value Ref Range   WBC  5.6 4.0 - 10.5 K/uL   RBC 3.74 (L) 3.87 - 5.11 MIL/uL   Hemoglobin 7.2 (L) 12.0 - 15.0 g/dL   HCT 16.1 (L) 09.6 - 04.5 %   MCV 68.4 (L) 78.0 - 100.0 fL   MCH 19.3 (L) 26.0 - 34.0 pg   MCHC 28.1 (L) 30.0 - 36.0 g/dL   RDW 40.9 (H) 81.1 - 91.4 %   Platelets 360 150 - 400 K/uL  Comprehensive metabolic panel     Status: None   Collection Time: 08/21/14 10:53 PM  Result Value Ref Range   Sodium 139 135 - 145  mmol/L   Potassium 3.9 3.5 - 5.1 mmol/L   Chloride 106 96 - 112 mmol/L   CO2 28 19 - 32 mmol/L   Glucose, Bld 91 70 - 99 mg/dL   BUN 13 6 - 23 mg/dL   Creatinine, Ser 1.61 0.50 - 1.10 mg/dL   Calcium 8.6 8.4 - 09.6 mg/dL   Total Protein 6.8 6.0 - 8.3 g/dL   Albumin 3.8 3.5 - 5.2 g/dL   AST 17 0 - 37 U/L   ALT 14 0 - 35 U/L   Alkaline Phosphatase 62 39 - 117 U/L   Total Bilirubin 0.8 0.3 - 1.2 mg/dL   GFR calc non Af Amer >90 >90 mL/min   GFR calc Af Amer >90 >90 mL/min   Anion gap 5 5 - 15  Type and screen     Status: None (Preliminary result)   Collection Time: 08/21/14 10:53 PM  Result Value Ref Range   ABO/RH(D) O POS    Antibody Screen NEG    Sample Expiration 08/24/2014    Unit Number [id number]    Blood Component Type RED CELLS,LR    Unit division 00    Status of Unit ISSUED    Transfusion Status OK TO TRANSFUSE    Crossmatch Result Compatible    Unit Number [id number]    Blood Component Type RED CELLS,LR    Unit division 00    Status of Unit ALLOCATED    Transfusion Status OK TO TRANSFUSE    Crossmatch Result Compatible   Troponin I     Status: None   Collection Time: 08/21/14 10:53 PM  Result Value Ref Range   Troponin I <0.03 <0.031 ng/mL  Wet prep, genital     Status: Abnormal   Collection Time: 08/22/14 12:25 AM  Result Value Ref Range   Yeast Wet Prep HPF POC NONE SEEN NONE SEEN   Trich, Wet Prep NONE SEEN NONE SEEN   Clue Cells Wet Prep HPF POC FEW (A) NONE SEEN   WBC, Wet Prep HPF POC NONE SEEN NONE SEEN  Prepare RBC     Status: None    Collection Time: 08/22/14  1:15 AM  Result Value Ref Range   Order Confirmation ORDER PROCESSED BY BLOOD BANK     IMAGING N/A  MAU COURSE EKG shows normal sinus rhythm. CBC, CMP, troponin, aspirin, normal saline fluid bolus. Patient's symptoms improving with fluid bolus.  Discussed presenting complaints, severe anemia and results of EKG and labs with Dr. Despina Hidden.   ASSESSMENT Symptomatic Anemia of acute blood loss Untreated Chlamydia Possible PID  PLAN Admit to third floor for blood transfusion, Premarin 25 mg IV x2 then reassess + megestrol 120 mg Rocephin and doxycycline for untreated chlamydia and low grade fever Gonorrhea and Chlamydia cultures pending.  Nathanuel Cabreja H 08/22/2014 7:27 AM

## 2014-08-23 DIAGNOSIS — A749 Chlamydial infection, unspecified: Secondary | ICD-10-CM | POA: Diagnosis present

## 2014-08-23 DIAGNOSIS — D62 Acute posthemorrhagic anemia: Secondary | ICD-10-CM

## 2014-08-23 DIAGNOSIS — N730 Acute parametritis and pelvic cellulitis: Secondary | ICD-10-CM | POA: Diagnosis present

## 2014-08-23 LAB — TYPE AND SCREEN
ABO/RH(D): O POS
Antibody Screen: NEGATIVE
UNIT DIVISION: 0
Unit division: 0

## 2014-08-23 LAB — HEMOGLOBIN AND HEMATOCRIT, BLOOD
HCT: 30.3 % — ABNORMAL LOW (ref 36.0–46.0)
Hemoglobin: 9 g/dL — ABNORMAL LOW (ref 12.0–15.0)

## 2014-08-23 MED ORDER — DOXYCYCLINE HYCLATE 100 MG PO TABS: 100.0000 mg | ORAL_TABLET | Freq: Two times a day (BID) | ORAL | Status: AC

## 2014-08-23 MED ORDER — MEGESTROL ACETATE 40 MG PO TABS: 40.0000 mg | ORAL_TABLET | Freq: Two times a day (BID) | ORAL | Status: DC

## 2014-08-23 NOTE — Discharge Instructions (Signed)
Menorrhagia °Menorrhagia is the medical term for when your menstrual periods are heavy or last longer than usual. With menorrhagia, every period you have may cause enough blood loss and cramping that you are unable to maintain your usual activities. °CAUSES  °In some cases, the cause of heavy periods is unknown, but a number of conditions may cause menorrhagia. Common causes include: °· A problem with the hormone-producing thyroid gland (hypothyroid). °· Noncancerous growths in the uterus (polyps or fibroids). °· An imbalance of the estrogen and progesterone hormones. °· One of your ovaries not releasing an egg during one or more months. °· Side effects of having an intrauterine device (IUD). °· Side effects of some medicines, such as anti-inflammatory medicines or blood thinners. °· A bleeding disorder that stops your blood from clotting normally. °SIGNS AND SYMPTOMS  °During a normal period, bleeding lasts between 4 and 8 days. Signs that your periods are too heavy include: °· You routinely have to change your pad or tampon every 1 or 2 hours because it is completely soaked. °· You pass blood clots larger than 1 inch (2.5 cm) in size. °· You have bleeding for more than 7 days. °· You need to use pads and tampons at the same time because of heavy bleeding. °· You need to wake up to change your pads or tampons during the night. °· You have symptoms of anemia, such as tiredness, fatigue, or shortness of breath.  °DIAGNOSIS  °Your health care provider will perform a physical exam and ask you questions about your symptoms and menstrual history. Other tests may be ordered based on what the health care provider finds during the exam. These tests can include: °· Blood tests. Blood tests are used to check if you are pregnant or have hormonal changes, a bleeding or thyroid disorder, low iron levels (anemia), or other problems. °· Endometrial biopsy. Your health care provider takes a sample of tissue from the inside of your  uterus to be examined under a microscope. °· Pelvic ultrasound. This test uses sound waves to make a picture of your uterus, ovaries, and vagina. The pictures can show if you have fibroids or other growths. °· Hysteroscopy. For this test, your health care provider will use a small telescope to look inside your uterus. °Based on the results of your initial tests, your health care provider may recommend further testing. °TREATMENT  °Treatment may not be needed. If it is needed, your health care provider may recommend treatment with one or more medicines first. If these do not reduce bleeding enough, a surgical treatment might be an option. The best treatment for you will depend on:  °· Whether you need to prevent pregnancy.   °· Your desire to have children in the future. °· The cause and severity of your bleeding. °· Your opinion and personal preference.   °Medicines for menorrhagia may include: °· Birth control methods that use hormones. These include the pill, skin patch, vaginal ring, shots that you get every 3 months, hormonal IUD, and implant. These treatments reduce bleeding during your menstrual period. °· Medicines that thicken blood and slow bleeding. °· Medicines that reduce swelling, such as ibuprofen.  °· Medicines that contain a synthetic hormone called progestin.   °· Medicines that make the ovaries stop working for a short time.   °You may need surgical treatment for menorrhagia if the medicines are unsuccessful. Treatment options include: °· Dilation and curettage (D&C). In this procedure, your health care provider opens (dilates) your cervix and then scrapes or suctions tissue from   the lining of your uterus to reduce menstrual bleeding.  Operative hysteroscopy. This procedure uses a tiny tube with a light (hysteroscope) to view your uterine cavity and can help in the surgical removal of a polyp that may be causing heavy periods.  Endometrial ablation. Through various techniques, your health care  provider permanently destroys the entire lining of your uterus (endometrium). After endometrial ablation, most women have little or no menstrual flow. Endometrial ablation reduces your ability to become pregnant.  Endometrial resection. This surgical procedure uses an electrosurgical wire loop to remove the lining of the uterus. This procedure also reduces your ability to become pregnant.  Hysterectomy. Surgical removal of the uterus and cervix is a permanent procedure that stops menstrual periods. Pregnancy is not possible after a hysterectomy. This procedure requires anesthesia and hospitalization. HOME CARE INSTRUCTIONS   Only take over-the-counter or prescription medicines as directed by your health care provider. Take prescribed medicines exactly as directed. Do not change or switch medicines without consulting your health care provider.  Take any prescribed iron pills exactly as directed by your health care provider. Long-term heavy bleeding may result in low iron levels. Iron pills help replace the iron your body lost from heavy bleeding. Iron may cause constipation. If this becomes a problem, increase the bran, fruits, and roughage in your diet.  Do not take aspirin or medicines that contain aspirin 1 week before or during your menstrual period. Aspirin may make the bleeding worse.  If you need to change your sanitary pad or tampon more than once every 2 hours, stay in bed and rest as much as possible until the bleeding stops.  Eat well-balanced meals. Eat foods high in iron. Examples are leafy green vegetables, meat, liver, eggs, and whole grain breads and cereals. Do not try to lose weight until the abnormal bleeding has stopped and your blood iron level is back to normal. SEEK MEDICAL CARE IF:   You soak through a pad or tampon every 1 or 2 hours, and this happens every time you have a period.  You need to use pads and tampons at the same time because you are bleeding so much.  You  need to change your pad or tampon during the night.  You have a period that lasts for more than 8 days.  You pass clots bigger than 1 inch wide.  You have irregular periods that happen more or less often than once a month.  You feel dizzy or faint.  You feel very weak or tired.  You feel short of breath or feel your heart is beating too fast when you exercise.  You have nausea and vomiting or diarrhea while you are taking your medicine.  You have any problems that may be related to the medicine you are taking. SEEK IMMEDIATE MEDICAL CARE IF:   You soak through 4 or more pads or tampons in 2 hours.  You have any bleeding while you are pregnant. MAKE SURE YOU:   Understand these instructions.  Will watch your condition.  Will get help right away if you are not doing well or get worse. Document Released: 05/24/2005 Document Revised: 05/29/2013 Document Reviewed: 11/12/2012 [facility] Patient Information 2015 [place], [place]. This information is not intended to replace advice given to you by your health care provider. Make sure you discuss any questions you have with your health care provider. Pelvic Inflammatory Disease Pelvic inflammatory disease (PID) refers to an infection in some or all of the female organs. The infection  can be in the uterus, ovaries, fallopian tubes, or the surrounding tissues in the pelvis. PID can cause abdominal or pelvic pain that comes on suddenly (acute pelvic pain). PID is a serious infection because it can lead to lasting (chronic) pelvic pain or the inability to have children (infertile).  CAUSES  The infection is often caused by the normal bacteria found in the vaginal tissues. PID may also be caused by an infection that is spread during sexual contact. PID can also occur following:   The birth of a baby.   A miscarriage.   An abortion.   Major pelvic surgery.   The use of an intrauterine device (IUD).   A sexual assault.  RISK  FACTORS Certain factors can put a person at higher risk for PID, such as:  Being younger than 25 years.  Being sexually active at [place] age.  Usingnonbarrier contraception.  Havingmultiple sexual partners.  Having sex with someone who has symptoms of a genital infection.  Using oral contraception. Other times, certain behaviors can increase the possibility of getting PID, such as:  Having sex during your period.  Using a vaginal douche.  Having an intrauterine device (IUD) in place. SYMPTOMS   Abdominal or pelvic pain.   Fever.   Chills.   Abnormal vaginal discharge.  Abnormal uterine bleeding.   Unusual pain shortly after finishing your period. DIAGNOSIS  Your caregiver will choose some of the following methods to make a diagnosis, such as:   Performinga physical exam and history. A pelvic exam typically reveals a very tender uterus and surrounding pelvis.   Ordering laboratory tests including a pregnancy test, blood tests, and urine test.  Orderingcultures of the vagina and cervix to check for a sexually transmitted infection (STI).  Performing an ultrasound.   Performing a laparoscopic procedure to look inside the pelvis.  TREATMENT   Antibiotic medicines may be prescribed and taken by mouth.   Sexual partners may be treated when the infection is caused by a sexually transmitted disease (STD).   Hospitalization may be needed to give antibiotics intravenously.  Surgery may be needed, but this is rare. It may take weeks until you are completely well. If you are diagnosed with PID, you should also be checked for human immunodeficiency virus (HIV). HOME CARE INSTRUCTIONS   If given, take your antibiotics as directed. Finish the medicine even if you start to feel better.   Only take over-the-counter or prescription medicines for pain, discomfort, or fever as directed by your caregiver.   Do not have sexual intercourse until treatment is  completed or as directed by your caregiver. If PID is confirmed, your recent sexual partner(s) will need treatment.   Keep your follow-up appointments. SEEK MEDICAL CARE IF:   You have increased or abnormal vaginal discharge.   You need prescription medicine for your pain.   You vomit.   You cannot take your medicines.   Your partner has an STD.  SEEK IMMEDIATE MEDICAL CARE IF:   You have a fever.   You have increased abdominal or pelvic pain.   You have chills.   You have pain when you urinate.   You are not better after 72 hours following treatment.  MAKE SURE YOU:   Understand these instructions.  Will watch your condition.  Will get help right away if you are not doing well or get worse. Document Released: 05/24/2005 Document Revised: 09/18/2012 Document Reviewed: 05/20/2011 [facility] Patient Information 2015 [place]. This information is not intended  to replace advice given to you by your health care provider. Make sure you discuss any questions you have with your health care provider. ° °

## 2014-08-23 NOTE — Progress Notes (Signed)
Subjective: Patient reports abdominal pain.  States she has stopped bleeding.  Objective: I have reviewed patient's vital signs, intake and output, medications, labs, microbiology and radiology results.  General: sleeping Resp: normal effort Cardio: regular rate and rhythm GI: soft, tender with palpation   Assessment/Plan:    Principal Problem:   Anemia due to acute blood loss Active Problems:   Menorrhagia   Chlamydia infection   PID (acute pelvic inflammatory disease)  Temperature curve improved.  No longer bleeding. Will send home with 14 days of Doxy. F/u in GYN clinic for treatment/management of uterine bleeding.  Phyllis Wilkins S 08/23/2014, 7:16 AM

## 2014-08-23 NOTE — Progress Notes (Signed)
Pt  Ambulated out   teachimg complete

## 2014-08-23 NOTE — Discharge Summary (Signed)
  Physician Discharge Summary  Patient ID: Phyllis Wilkins MRN: [id number] DOB/AGE: 33/01/1981 33 y.o.  Admit date: 08/21/2014 Discharge date: 08/23/2014   Discharge Diagnoses:  Principal Problem:   Anemia due to acute blood loss Active Problems:   Menorrhagia   Chlamydia infection   PID (acute pelvic inflammatory disease)   Consults: None  Significant Diagnostic Studies: labs: Chlamydia +  CBC    Component Value Date/Time   WBC 5.6 08/21/2014 2253   RBC 3.74* 08/21/2014 2253   HGB 9.0* 08/22/2014 0840   HCT 30.3* 08/22/2014 0840   PLT 360 08/21/2014 2253   MCV 68.4* 08/21/2014 2253   MCH 19.3* 08/21/2014 2253   MCHC 28.1* 08/21/2014 2253   RDW 16.9* 08/21/2014 2253   LYMPHSABS 2.4 03/08/2014 0434   MONOABS 0.7 03/08/2014 0434   EOSABS 0.4 03/08/2014 0434   BASOSABS 0.0 03/08/2014 0434     Hospital Course: Admitted with menorrhagia with ABL anemia and abdominal pain.  Had untreated chlamydia. Started on IV antibiotics, IV premarin and Megace. Bleeding subsided, transfused 2 u PRBC's, post transfusion Hgb is normal. Remained afebrile, abdominal pain improved. Maximally benefited from hospitalization and deemed stable for discharge.    Disposition: 01-Home or Self Care  Discharged Condition: fair  Discharge Instructions    Call MD for:  persistant nausea and vomiting    Complete by:  As directed      Call MD for:  severe uncontrolled pain    Complete by:  As directed      Call MD for:  temperature >100.4    Complete by:  As directed      Diet - low sodium heart healthy    Complete by:  As directed      Increase activity slowly    Complete by:  As directed      No wound care    Complete by:  As directed             Medication List    TAKE these medications        doxycycline 100 MG tablet  Commonly known as:  VIBRA-TABS  Take 1 tablet (100 mg total) by mouth every 12 (twelve) hours.     HYDROcodone-acetaminophen 5-325 MG per tablet  Commonly known as:   NORCO/VICODIN  Take 1 tablet by mouth 2 (two) times daily as needed for severe pain.     ibuprofen 200 MG tablet  Commonly known as:  ADVIL,MOTRIN  Take 400 mg by mouth every 6 (six) hours as needed for mild pain.     megestrol 40 MG tablet  Commonly known as:  MEGACE  Take 1 tablet (40 mg total) by mouth 2 (two) times daily.           Follow-up Information    [facility].   Specialty:  Obstetrics and Gynecology   Why:  Hosptial follow-up   Contact information:   [address] [place] [id number] [phone]      Signed: Reva BoresRATT,Anjelina Dung S 08/23/2014, 7:30 AM

## 2014-09-25 ENCOUNTER — Ambulatory Visit: Payer: Self-pay | Admitting: Family Medicine

## 2014-10-24 ENCOUNTER — Emergency Department (HOSPITAL_COMMUNITY)
Admission: EM | Admit: 2014-10-24 | Discharge: 2014-10-24 | Disposition: A | Discharge status: Home / Self Care | Payer: Self-pay | Attending: Emergency Medicine | Admitting: Emergency Medicine

## 2014-10-24 ENCOUNTER — Encounter (HOSPITAL_COMMUNITY): Payer: Self-pay | Admitting: Emergency Medicine

## 2014-10-24 DIAGNOSIS — Z9851 Tubal ligation status: Secondary | ICD-10-CM | POA: Insufficient documentation

## 2014-10-24 DIAGNOSIS — Z8742 Personal history of other diseases of the female genital tract: Secondary | ICD-10-CM | POA: Insufficient documentation

## 2014-10-24 DIAGNOSIS — G8929 Other chronic pain: Secondary | ICD-10-CM | POA: Insufficient documentation

## 2014-10-24 DIAGNOSIS — R1110 Vomiting, unspecified: Secondary | ICD-10-CM

## 2014-10-24 DIAGNOSIS — Z720 Tobacco use: Secondary | ICD-10-CM | POA: Insufficient documentation

## 2014-10-24 DIAGNOSIS — Z8719 Personal history of other diseases of the digestive system: Secondary | ICD-10-CM | POA: Insufficient documentation

## 2014-10-24 DIAGNOSIS — Z9089 Acquired absence of other organs: Secondary | ICD-10-CM | POA: Insufficient documentation

## 2014-10-24 DIAGNOSIS — Z3202 Encounter for pregnancy test, result negative: Secondary | ICD-10-CM | POA: Insufficient documentation

## 2014-10-24 DIAGNOSIS — R1084 Generalized abdominal pain: Secondary | ICD-10-CM | POA: Insufficient documentation

## 2014-10-24 DIAGNOSIS — Z8639 Personal history of other endocrine, nutritional and metabolic disease: Secondary | ICD-10-CM | POA: Insufficient documentation

## 2014-10-24 DIAGNOSIS — R197 Diarrhea, unspecified: Secondary | ICD-10-CM | POA: Insufficient documentation

## 2014-10-24 DIAGNOSIS — R112 Nausea with vomiting, unspecified: Secondary | ICD-10-CM | POA: Insufficient documentation

## 2014-10-24 DIAGNOSIS — Z862 Personal history of diseases of the blood and blood-forming organs and certain disorders involving the immune mechanism: Secondary | ICD-10-CM | POA: Insufficient documentation

## 2014-10-24 LAB — URINALYSIS, ROUTINE W REFLEX MICROSCOPIC
BILIRUBIN URINE: NEGATIVE
GLUCOSE, UA: NEGATIVE mg/dL
HGB URINE DIPSTICK: NEGATIVE
KETONES UR: NEGATIVE mg/dL
Leukocytes, UA: NEGATIVE
Nitrite: NEGATIVE
PH: 5.5 (ref 5.0–8.0)
Protein, ur: NEGATIVE mg/dL
Specific Gravity, Urine: 1.03 (ref 1.005–1.030)
Urobilinogen, UA: 0.2 mg/dL (ref 0.0–1.0)

## 2014-10-24 LAB — POC URINE PREG, ED: Preg Test, Ur: NEGATIVE

## 2014-10-24 LAB — CBC WITH DIFFERENTIAL/PLATELET
BASOS PCT: 0 % (ref 0–1)
Basophils Absolute: 0 10*3/uL (ref 0.0–0.1)
EOS ABS: 0.2 10*3/uL (ref 0.0–0.7)
Eosinophils Relative: 2 % (ref 0–5)
HEMATOCRIT: 35.2 % — AB (ref 36.0–46.0)
Hemoglobin: 11.1 g/dL — ABNORMAL LOW (ref 12.0–15.0)
LYMPHS ABS: 1.3 10*3/uL (ref 0.7–4.0)
LYMPHS PCT: 17 % (ref 12–46)
MCH: 26.1 pg (ref 26.0–34.0)
MCHC: 31.5 g/dL (ref 30.0–36.0)
MCV: 82.8 fL (ref 78.0–100.0)
MONOS PCT: 10 % (ref 3–12)
Monocytes Absolute: 0.8 10*3/uL (ref 0.1–1.0)
NEUTROS ABS: 5.3 10*3/uL (ref 1.7–7.7)
Neutrophils Relative %: 71 % (ref 43–77)
Platelets: 279 10*3/uL (ref 150–400)
RBC: 4.25 MIL/uL (ref 3.87–5.11)
RDW: 22.3 % — ABNORMAL HIGH (ref 11.5–15.5)
WBC: 7.6 10*3/uL (ref 4.0–10.5)

## 2014-10-24 LAB — LIPASE, BLOOD: LIPASE: 21 U/L — AB (ref 22–51)

## 2014-10-24 LAB — COMPREHENSIVE METABOLIC PANEL
ALBUMIN: 3.9 g/dL (ref 3.5–5.0)
ALT: 13 U/L — ABNORMAL LOW (ref 14–54)
ANION GAP: 7 (ref 5–15)
AST: 17 U/L (ref 15–41)
Alkaline Phosphatase: 65 U/L (ref 38–126)
BUN: 15 mg/dL (ref 6–20)
CALCIUM: 9.2 mg/dL (ref 8.9–10.3)
CO2: 25 mmol/L (ref 22–32)
CREATININE: 0.81 mg/dL (ref 0.44–1.00)
Chloride: 106 mmol/L (ref 101–111)
GFR calc non Af Amer: >60 mL/min (ref >60)
Glucose, Bld: 98 mg/dL (ref 65–99)
Potassium: 4.3 mmol/L (ref 3.5–5.1)
Sodium: 138 mmol/L (ref 135–145)
TOTAL PROTEIN: 7.1 g/dL (ref 6.5–8.1)
Total Bilirubin: 0.6 mg/dL (ref 0.3–1.2)

## 2014-10-24 MED ORDER — SODIUM CHLORIDE 0.9 % IV BOLUS (SEPSIS)
2000.0000 mL | Freq: Once | INTRAVENOUS | Status: AC
  Administered 2014-10-24: 2000 mL via INTRAVENOUS

## 2014-10-24 MED ORDER — MORPHINE SULFATE 4 MG/ML IJ SOLN
4.0000 mg | Freq: Once | INTRAMUSCULAR | Status: AC
Start: 2014-10-24 — End: 2014-10-24
  Administered 2014-10-24: 4 mg via INTRAVENOUS
  Filled 2014-10-24: qty 1

## 2014-10-24 MED ORDER — ONDANSETRON HCL 4 MG/2ML IJ SOLN
4.0000 mg | Freq: Once | INTRAMUSCULAR | Status: AC
  Administered 2014-10-24: 4 mg via INTRAVENOUS
  Filled 2014-10-24: qty 2

## 2014-10-24 MED ORDER — TRAMADOL HCL 50 MG PO TABS
50.0000 mg | ORAL_TABLET | Freq: Four times a day (QID) | ORAL | Status: AC | PRN
Start: 2014-10-24 — End: ?

## 2014-10-24 MED ORDER — ONDANSETRON 4 MG PO TBDP: ORAL_TABLET | ORAL | Status: AC

## 2014-10-24 MED ORDER — DICYCLOMINE HCL 20 MG PO TABS: 20.0000 mg | ORAL_TABLET | Freq: Two times a day (BID) | ORAL | Status: AC | PRN

## 2014-10-24 NOTE — ED Notes (Signed)
Ambulated to restroom with one assist.  

## 2014-10-24 NOTE — ED Provider Notes (Signed)
Signed out by Dr Ranae PalmsYelverton at [phone] to reassess post ivf, and that he anticipated subsequent d/c of pt.  Went to recheck pt, pt has left room and ED without notifying myself or staff (it appears pt had taken out her own iv and left).      Cathren LaineKevin Nawal Burling, MD 10/24/14 [phone]

## 2014-10-24 NOTE — ED Notes (Signed)
Unable to obtain vital signs and signature, due to Pt leaving prior to d/c being completed.

## 2014-10-24 NOTE — ED Provider Notes (Signed)
CSN: [id number]     Arrival date & time 10/24/14  0454 History   First MD Initiated Contact with Patient 10/24/14 [phone]     Chief Complaint  Patient presents with  . Abdominal Pain     (Consider location/radiation/quality/duration/timing/severity/associated sxs/prior Treatment) HPI Patient presents with pain starting 4 days ago. She states she's had nausea and vomiting and some loose stools. Vomited once today. She states the pain radiates to her back. Similar to pain she had with pancreatitis. She said no fever or chills. Patient has chronic pain in the lower abdomen from her endometriosis. This is unchanged. No urinary symptoms. Past Medical History  Diagnosis Date  . Pancreatitis   . Endometriosis 2013  . Thyroid disease   . Endometrial polyp   . Anemia    Past Surgical History  Procedure Laterality Date  . Cholecystectomy    . Tubal ligation     Family History  Problem Relation Age of Onset  . Hypertension Father   . Diabetes Father   . Breast cancer Mother   . Hyperlipidemia Mother   . Hypertension Mother    History  Substance Use Topics  . Smoking status: Current Every Day Smoker -- 0.25 packs/day    Types: Cigarettes  . Smokeless tobacco: Not on file  . Alcohol Use: Yes     Comment: occas.   OB History    Gravida Para Term Preterm AB TAB SAB Ectopic Multiple Living   6 6 1 5      6      Review of Systems  Constitutional: Negative for fever and chills.  Respiratory: Negative for cough and shortness of breath.   Cardiovascular: Negative for chest pain.  Gastrointestinal: Positive for nausea, vomiting, abdominal pain and diarrhea.  Genitourinary: Negative for dysuria, frequency, flank pain, vaginal bleeding and vaginal discharge.  Musculoskeletal: Negative for myalgias, neck pain and neck stiffness.  Skin: Negative for rash and wound.  Neurological: Negative for dizziness, weakness, light-headedness, numbness and headaches.  All other systems reviewed and are  negative.     Allergies  Review of patient's allergies indicates no known allergies.  Home Medications   Prior to Admission medications   Medication Sig Start Date End Date Taking? Authorizing Provider  acetaminophen (TYLENOL) 325 MG tablet Take 650 mg by mouth every 6 (six) hours as needed for mild pain.   Yes Historical Provider, MD  ibuprofen (ADVIL,MOTRIN) 200 MG tablet Take 400 mg by mouth every 6 (six) hours as needed for mild pain.   Yes Historical Provider, MD  dicyclomine (BENTYL) 20 MG tablet Take 1 tablet (20 mg total) by mouth 2 (two) times daily as needed for spasms. 10/24/14   Loren Raceravid Parvin Stetzer, MD  doxycycline (VIBRA-TABS) 100 MG tablet Take 1 tablet (100 mg total) by mouth every 12 (twelve) hours. Patient not taking: Reported on 10/24/2014 08/23/14   Reva Boresanya S Pratt, MD  HYDROcodone-acetaminophen (NORCO/VICODIN) 5-325 MG per tablet Take 1 tablet by mouth 2 (two) times daily as needed for severe pain. Patient not taking: Reported on 10/24/2014 03/08/14   Tomasita CrumbleAdeleke Oni, MD  megestrol (MEGACE) 40 MG tablet Take 1 tablet (40 mg total) by mouth 2 (two) times daily. Patient not taking: Reported on 10/24/2014 08/23/14   Reva Boresanya S Pratt, MD  ondansetron (ZOFRAN ODT) 4 MG disintegrating tablet 4mg  ODT q4 hours prn nausea/vomit 10/24/14   Loren Raceravid Flara Storti, MD  traMADol (ULTRAM) 50 MG tablet Take 1 tablet (50 mg total) by mouth every 6 (six) hours as needed for  moderate pain. 10/24/14   Loren Raceravid Shyne Lehrke, MD   BP 140/76 mmHg  Pulse 96  Temp(Src) 99.5 F (37.5 C)  Resp 18  Ht 5\' 9"  (1.753 m)  Wt 190 lb (86.183 kg)  BMI 28.05 kg/m2  SpO2 100% Physical Exam  Constitutional: She is oriented to person, place, and time. She appears well-developed and well-nourished. No distress.  HENT:  Head: Normocephalic and atraumatic.  Mouth/Throat: Oropharynx is clear and moist.  Eyes: EOM are normal. Pupils are equal, round, and reactive to light.  Neck: Normal range of motion. Neck supple.  Cardiovascular:  Normal rate and regular rhythm.   Pulmonary/Chest: Effort normal and breath sounds normal. No respiratory distress. She has no wheezes. She has no rales. She exhibits no tenderness.  Abdominal: Soft. Bowel sounds are normal. She exhibits no distension and no mass. There is tenderness (diffuse abdominal tenderness more pronounced in the epigastric region. There is no rebound or guarding.). There is no rebound and no guarding.  Musculoskeletal: Normal range of motion. She exhibits no edema or tenderness.  No CVA tenderness bilaterally.  Neurological: She is alert and oriented to person, place, and time.  Skin: Skin is warm and dry. No rash noted. No erythema.  Psychiatric: She has a normal mood and affect. Her behavior is normal.  Nursing note and vitals reviewed.   ED Course  Procedures (including critical care time) Labs Review Labs Reviewed  CBC WITH DIFFERENTIAL/PLATELET - Abnormal; Notable for the following:    Hemoglobin 11.1 (*)    HCT 35.2 (*)    RDW 22.3 (*)    All other components within normal limits  COMPREHENSIVE METABOLIC PANEL - Abnormal; Notable for the following:    ALT 13 (*)    All other components within normal limits  LIPASE, BLOOD - Abnormal; Notable for the following:    Lipase 21 (*)    All other components within normal limits  URINALYSIS, ROUTINE W REFLEX MICROSCOPIC - Abnormal; Notable for the following:    APPearance CLOUDY (*)    All other components within normal limits  POC URINE PREG, ED    Imaging Review No results found.   EKG Interpretation None      MDM   Final diagnoses:  Generalized abdominal pain  Vomiting and diarrhea   Abdominal pain is improved. Labs within normal limits. Normal lipase. Pt's abd exam remains benign. Pt has history of chronic abdominal pain with multiple CT scans. Do not believe that repeat imaging is necessary at this point. Anticipate d/c home.     Loren Raceravid Nevaan Bunton, MD 10/25/14 0500

## 2014-10-24 NOTE — ED Notes (Signed)
This RN attempted to assess Pt again.  Pt not in room or in any restroom.  20G IV catheter w/ 2- 1L NS bags and IV dressing attached and a hospital gown found in restroom.  Charge RN attempted to call contact number, but Pt's mailbox was full.  MD made aware.    Pt left several bracelets in room.  Items put in [facility].

## 2014-10-24 NOTE — Discharge Instructions (Signed)
Abdominal Pain, Women °Abdominal (stomach, pelvic, or belly) pain can be caused by many things. It is important to tell your doctor: °· The location of the pain. °· Does it come and go or is it present all the time? °· Are there things that start the pain (eating certain foods, exercise)? °· Are there other symptoms associated with the pain (fever, nausea, vomiting, diarrhea)? °All of this is helpful to know when trying to find the cause of the pain. °CAUSES  °· Stomach: virus or bacteria infection, or ulcer. °· Intestine: appendicitis (inflamed appendix), regional ileitis (Crohn's disease), ulcerative colitis (inflamed colon), irritable bowel syndrome, diverticulitis (inflamed diverticulum of the colon), or cancer of the stomach or intestine. °· Gallbladder disease or stones in the gallbladder. °· Kidney disease, kidney stones, or infection. °· Pancreas infection or cancer. °· Fibromyalgia (pain disorder). °· Diseases of the female organs: °¨ Uterus: fibroid (non-cancerous) tumors or infection. °¨ Fallopian tubes: infection or tubal pregnancy. °¨ Ovary: cysts or tumors. °¨ Pelvic adhesions (scar tissue). °¨ Endometriosis (uterus lining tissue growing in the pelvis and on the pelvic organs). °¨ Pelvic congestion syndrome (female organs filling up with blood just before the menstrual period). °¨ Pain with the menstrual period. °¨ Pain with ovulation (producing an egg). °¨ Pain with an IUD (intrauterine device, birth control) in the uterus. °¨ Cancer of the female organs. °· Functional pain (pain not caused by a disease, may improve without treatment). °· Psychological pain. °· Depression. °DIAGNOSIS  °Your doctor will decide the seriousness of your pain by doing an examination. °· Blood tests. °· X-rays. °· Ultrasound. °· CT scan (computed tomography, special type of X-ray). °· MRI (magnetic resonance imaging). °· Cultures, for infection. °· Barium enema (dye inserted in the large intestine, to better view it with  X-rays). °· Colonoscopy (looking in intestine with a lighted tube). °· Laparoscopy (minor surgery, looking in abdomen with a lighted tube). °· Major abdominal exploratory surgery (looking in abdomen with a large incision). °TREATMENT  °The treatment will depend on the cause of the pain.  °· Many cases can be observed and treated at home. °· Over-the-counter medicines recommended by your caregiver. °· Prescription medicine. °· Antibiotics, for infection. °· Birth control pills, for painful periods or for ovulation pain. °· Hormone treatment, for endometriosis. °· Nerve blocking injections. °· Physical therapy. °· Antidepressants. °· Counseling with a psychologist or psychiatrist. °· Minor or major surgery. °HOME CARE INSTRUCTIONS  °· Do not take laxatives, unless directed by your caregiver. °· Take over-the-counter pain medicine only if ordered by your caregiver. Do not take aspirin because it can cause an upset stomach or bleeding. °· Try a clear liquid diet (broth or water) as ordered by your caregiver. Slowly move to a bland diet, as tolerated, if the pain is related to the stomach or intestine. °· Have a thermometer and take your temperature several times a day, and record it. °· Bed rest and sleep, if it helps the pain. °· Avoid sexual intercourse, if it causes pain. °· Avoid stressful situations. °· Keep your follow-up appointments and tests, as your caregiver orders. °· If the pain does not go away with medicine or surgery, you may try: °¨ Acupuncture. °¨ Relaxation exercises (yoga, meditation). °¨ Group therapy. °¨ Counseling. °SEEK MEDICAL CARE IF:  °· You notice certain foods cause stomach pain. °· Your home care treatment is not helping your pain. °· You need stronger pain medicine. °· You want your IUD removed. °· You feel faint or   lightheaded. °· You develop nausea and vomiting. °· You develop a rash. °· You are having side effects or an allergy to your medicine. °SEEK IMMEDIATE MEDICAL CARE IF:  °· Your  pain does not go away or gets worse. °· You have a fever. °· Your pain is felt only in portions of the abdomen. The right side could possibly be appendicitis. The left lower portion of the abdomen could be colitis or diverticulitis. °· You are passing blood in your stools (bright red or black tarry stools, with or without vomiting). °· You have blood in your urine. °· You develop chills, with or without a fever. °· You pass out. °MAKE SURE YOU:  °· Understand these instructions. °· Will watch your condition. °· Will get help right away if you are not doing well or get worse. °Document Released: 03/21/2007 Document Revised: 10/08/2013 Document Reviewed: 04/10/2009 °ExitCare® Patient Information ©2015 ExitCare, LLC. This information is not intended to replace advice given to you by your health care provider. Make sure you discuss any questions you have with your health care provider. ° °

## 2014-10-24 NOTE — ED Notes (Signed)
Pt is c/o abd pain that started 4 days ago  Pt states she has had N/V/D for the past 3 days  Pt states her pain got worse at 1730 after eating supper and the pain is sharp and radiates into her back  Pt has history of pancreatitis  Pt has had her gallbladder removed

## 2014-10-24 NOTE — ED Notes (Signed)
Patient reports pain is similar to previous pancreatitis exacerbation.

## 2014-10-24 NOTE — ED Notes (Signed)
Attempted to assess Pt and was notified that she was in the restroom.

## 2014-12-11 ENCOUNTER — Telehealth: Payer: Self-pay | Admitting: *Deleted

## 2014-12-11 NOTE — Telephone Encounter (Signed)
Pt left message stating that someone had called her to reschedule an appt.  Please call back.

## 2014-12-11 NOTE — Telephone Encounter (Signed)
Patient left message on the nurse voicemail on 12/11/14 at 1113.  States she left a message yesterday and hasn't received a return call yet.  Requests someone call her.

## 2014-12-13 NOTE — Telephone Encounter (Signed)
Called patient back and got voicemail, left message for patient to call us back if she still needs assistance.

## 2014-12-20 ENCOUNTER — Emergency Department (HOSPITAL_COMMUNITY)
Admission: EM | Admit: 2014-12-20 | Discharge: 2014-12-21 | Disposition: A | Discharge status: Home / Self Care | Payer: Self-pay | Attending: Emergency Medicine | Admitting: Emergency Medicine

## 2014-12-20 DIAGNOSIS — Y9289 Other specified places as the place of occurrence of the external cause: Secondary | ICD-10-CM | POA: Insufficient documentation

## 2014-12-20 DIAGNOSIS — W1839XA Other fall on same level, initial encounter: Secondary | ICD-10-CM | POA: Insufficient documentation

## 2014-12-20 DIAGNOSIS — Z720 Tobacco use: Secondary | ICD-10-CM | POA: Insufficient documentation

## 2014-12-20 DIAGNOSIS — Y998 Other external cause status: Secondary | ICD-10-CM | POA: Insufficient documentation

## 2014-12-20 DIAGNOSIS — M545 Low back pain: Secondary | ICD-10-CM

## 2014-12-20 DIAGNOSIS — Z8719 Personal history of other diseases of the digestive system: Secondary | ICD-10-CM | POA: Insufficient documentation

## 2014-12-20 DIAGNOSIS — Y9389 Activity, other specified: Secondary | ICD-10-CM | POA: Insufficient documentation

## 2014-12-20 DIAGNOSIS — Z862 Personal history of diseases of the blood and blood-forming organs and certain disorders involving the immune mechanism: Secondary | ICD-10-CM | POA: Insufficient documentation

## 2014-12-20 DIAGNOSIS — E669 Obesity, unspecified: Secondary | ICD-10-CM | POA: Insufficient documentation

## 2014-12-20 DIAGNOSIS — Z8742 Personal history of other diseases of the female genital tract: Secondary | ICD-10-CM | POA: Insufficient documentation

## 2014-12-20 DIAGNOSIS — S3992XA Unspecified injury of lower back, initial encounter: Secondary | ICD-10-CM | POA: Insufficient documentation

## 2014-12-20 NOTE — ED Notes (Signed)
Pt fell while trying to get out of a [employer] truck this afternoon.  States she hit her back on curb.  Abrasion noted to lower back.

## 2014-12-21 ENCOUNTER — Emergency Department (HOSPITAL_COMMUNITY): Payer: Self-pay

## 2014-12-21 ENCOUNTER — Encounter (HOSPITAL_COMMUNITY): Payer: Self-pay | Admitting: Emergency Medicine

## 2014-12-21 LAB — I-STAT BETA HCG BLOOD, ED (MC, WL, AP ONLY): I-stat hCG, quantitative: <5 m[IU]/mL (ref <5)

## 2014-12-21 MED ORDER — METHOCARBAMOL 500 MG PO TABS: 500.0000 mg | ORAL_TABLET | Freq: Two times a day (BID) | ORAL | Status: AC

## 2014-12-21 MED ORDER — HYDROMORPHONE HCL 1 MG/ML IJ SOLN
1.0000 mg | Freq: Once | INTRAMUSCULAR | Status: AC
  Administered 2014-12-21: 1 mg via INTRAMUSCULAR
  Filled 2014-12-21: qty 1

## 2014-12-21 MED ORDER — KETOROLAC TROMETHAMINE 60 MG/2ML IM SOLN: 60.0000 mg | Freq: Once | INTRAMUSCULAR | Status: AC

## 2014-12-21 MED ORDER — HYDROCODONE-ACETAMINOPHEN 5-325 MG PO TABS
ORAL_TABLET | ORAL | Status: AC
  Filled 2014-12-21: qty 1

## 2014-12-21 MED ORDER — HYDROCODONE-ACETAMINOPHEN 5-325 MG PO TABS
2.0000 | ORAL_TABLET | Freq: Once | ORAL | Status: AC
  Administered 2014-12-21: 2 via ORAL

## 2014-12-21 MED ORDER — HYDROCODONE-ACETAMINOPHEN 5-325 MG PO TABS: 1.0000 | ORAL_TABLET | Freq: Three times a day (TID) | ORAL | Status: AC | PRN

## 2014-12-21 MED ORDER — NAPROXEN 500 MG PO TABS: 500.0000 mg | ORAL_TABLET | Freq: Two times a day (BID) | ORAL | Status: DC

## 2014-12-21 MED ORDER — KETOROLAC TROMETHAMINE 30 MG/ML IJ SOLN
INTRAMUSCULAR | Status: AC
  Administered 2014-12-21: 60 mg
  Filled 2014-12-21: qty 2

## 2014-12-21 MED ORDER — ONDANSETRON 4 MG PO TBDP
4.0000 mg | ORAL_TABLET | Freq: Once | ORAL | Status: AC
  Administered 2014-12-21: 4 mg via ORAL
  Filled 2014-12-21: qty 1

## 2014-12-21 NOTE — ED Notes (Signed)
Unable to locate patient for discharge. Phyllis EndoKelly PA spoke with patient. Pt did not receive discharge papers prior to leaving.

## 2014-12-21 NOTE — ED Provider Notes (Signed)
[id number] - Patient care assumed from Fayrene HelperBowie Tran, PA-C at change of shift. Patient presenting for back pain with onset at 1400 yesterday after a fall against a curb. No numbness or weakness. No hematuria. No red flags or signs concerning for cauda equina. Plan discussed with Ardelle Parkran, PA-C which includes d/c if xray negative. Imaging reviewed; this shows normal alignment without fracture or bony deformity. Normal disc spaces. Will d/c with instructions for pain control. Return precautions given at discharge.  Results for orders placed or performed during the hospital encounter of 12/20/14  I-Stat Beta hCG blood, ED ([facility], [facility], [facility] only)  Result Value Ref Range   I-stat hCG, quantitative <5.0 <5 mIU/mL   Comment 3           Dg Lumbar Spine Complete  12/21/2014   CLINICAL DATA:  Larey SeatFell from back of a truck. Low back pain radiating to the left buttock.  EXAM: LUMBAR SPINE - COMPLETE 4+ VIEW  COMPARISON:  None.  FINDINGS: There is no evidence of lumbar spine fracture. Alignment is normal. Intervertebral disc spaces are maintained.  IMPRESSION: Negative.   Electronically Signed   By: Burman NievesWilliam  Stevens M.D.   On: 12/21/2014 01:35   Medications  ketorolac (TORADOL) injection 60 mg (not administered)  HYDROcodone-acetaminophen (NORCO/VICODIN) 5-325 MG per tablet 2 tablet (not administered)  HYDROmorphone (DILAUDID) injection 1 mg (1 mg Intramuscular Given 12/21/14 0013)  ondansetron (ZOFRAN-ODT) disintegrating tablet 4 mg (4 mg Oral Given 12/21/14 0038)     Antony MaduraKelly Nelline Lio, PA-C 12/21/14 [id number]  Tomasita CrumbleAdeleke Oni, MD 12/21/14 [phone]

## 2014-12-21 NOTE — Discharge Instructions (Signed)
Take naproxen and Robaxin as prescribed for pain control. Alternate ice and heat to areas of injury. Do this 3-4 times per day for 15-20 minutes each time. You may take Norco as needed for severe pain. Follow-up with a primary care doctor for further evaluation of your symptoms. Return to the emergency department as needed if symptoms worsen.  Back Pain, Adult Low back pain is very common. About 1 in 5 people have back pain.The cause of low back pain is rarely dangerous. The pain often gets better over time.About half of people with a sudden onset of back pain feel better in just 2 weeks. About 8 in 10 people feel better by 6 weeks.  CAUSES Some common causes of back pain include:  Strain of the muscles or ligaments supporting the spine.  Wear and tear (degeneration) of the spinal discs.  Arthritis.  Direct injury to the back. DIAGNOSIS Most of the time, the direct cause of low back pain is not known.However, back pain can be treated effectively even when the exact cause of the pain is unknown.Answering your caregiver's questions about your overall health and symptoms is one of the most accurate ways to make sure the cause of your pain is not dangerous. If your caregiver needs more information, he or she may order lab work or imaging tests (X-rays or MRIs).However, even if imaging tests show changes in your back, this usually does not require surgery. HOME CARE INSTRUCTIONS For many people, back pain returns.Since low back pain is rarely dangerous, it is often a condition that people can learn to [facility] their own.   Remain active. It is stressful on the back to sit or stand in one place. Do not sit, drive, or stand in one place for more than 30 minutes at a time. Take short walks on level surfaces as soon as pain allows.Try to increase the length of time you walk each day.  Do not stay in bed.Resting more than 1 or 2 days can delay your recovery.  Do not avoid exercise or work.Your  body is made to move.It is not dangerous to be active, even though your back may hurt.Your back will likely heal faster if you return to being active before your pain is gone.  Pay attention to your body when you bend and lift. Many people have less discomfortwhen lifting if they bend their knees, keep the load close to their bodies,and avoid twisting. Often, the most comfortable positions are those that put less stress on your recovering back.  Find a comfortable position to sleep. Use a firm mattress and lie on your side with your knees slightly bent. If you lie on your back, put a pillow under your knees.  Only take over-the-counter or prescription medicines as directed by your caregiver. Over-the-counter medicines to reduce pain and inflammation are often the most helpful.Your caregiver may prescribe muscle relaxant drugs.These medicines help dull your pain so you can more quickly return to your normal activities and healthy exercise.  Put ice on the injured area.  Put ice in a plastic bag.  Place a towel between your skin and the bag.  Leave the ice on for 15-20 minutes, 03-04 times a day for the first 2 to 3 days. After that, ice and heat may be alternated to reduce pain and spasms.  Ask your caregiver about trying back exercises and gentle massage. This may be of some benefit.  Avoid feeling anxious or stressed.Stress increases muscle tension and can worsen back pain.It  is important to recognize when you are anxious or stressed and learn ways to manage it.Exercise is a great option. SEEK MEDICAL CARE IF:  You have pain that is not relieved with rest or medicine.  You have pain that does not improve in 1 week.  You have new symptoms.  You are generally not feeling well. SEEK IMMEDIATE MEDICAL CARE IF:   You have pain that radiates from your back into your legs.  You develop new bowel or bladder control problems.  You have unusual weakness or numbness in your arms or  legs.  You develop nausea or vomiting.  You develop abdominal pain.  You feel faint. Document Released: 05/24/2005 Document Revised: 11/23/2011 Document Reviewed: 09/25/2013 [facility] Patient Information 2015 [place], [place]. This information is not intended to replace advice given to you by your health care provider. Make sure you discuss any questions you have with your health care provider.   Emergency Department Resource Guide 1) Find a Doctor and Pay Out of Pocket Although you won't have to find out who is covered by your insurance plan, it is a good idea to ask around and get recommendations. You will then need to call the office and see if the doctor you have chosen will accept you as a new patient and what types of options they offer for patients who are self-pay. Some doctors offer discounts or will set up payment plans for their patients who do not have insurance, but you will need to ask so you aren't surprised when you get to your appointment.  2) Contact Your Local Health Department Not all health departments have doctors that can see patients for sick visits, but many do, so it is worth a call to see if yours does. If you don't know where your local health department is, you can check in your phone book. The CDC also has a tool to help you locate your state's health department, and many state websites also have listings of all of their local health departments.  3) Find a Walk-in Clinic If your illness is not likely to be very severe or complicated, you may want to try a walk in clinic. These are popping up all over the country in pharmacies, drugstores, and shopping centers. They're usually staffed by nurse practitioners or physician assistants that have been trained to treat common illnesses and complaints. They're usually fairly quick and inexpensive. However, if you have serious medical issues or chronic medical problems, these are probably not your best option.  No Primary Care  Doctor: - Call Health Connect at  [phone] - they can help you locate a primary care doctor that  accepts your insurance, provides certain services, etc. - Physician Referral Service- [phone]  Chronic Pain Problems: Organization         Address  Phone   Notes  [facility]  [phone] Patients need to be referred by their primary care doctor.   Medication Assistance: Organization         Address  Phone   Notes  [facility] Medication [facility] [address] [place], [place] [phone] --Must be a resident of [facility] -- Must have NO insurance coverage whatsoever (no Medicaid/ Medicare, etc.) -- The pt. MUST have a primary care doctor that directs their care regularly and follows them in the community   Med[facility]ist  [phone]   [employer]  [phone]    Agencies that provide inexpensive medical  care: Organization         Address  Phone   Notes  [facility]  [phone]   [facility]    [phone]   [facility] [address] [place], [place] [address] [phone]   [facility] [address] [place] [phone]   Planned Parenthood    [phone]   [facility]    [phone]   [facility]  [address], [place] Phone:  [phone], Fax:  [phone] Hours of Operation:  9 am - 6 pm, M-F.  Also accepts Medicaid/Medicare and self-pay.  [facility]  [address], [place] Phone: [phone], Fax: [phone]. Hours of Operation:  8:30 am - 5:30 pm, M-F.  Also accepts Medicaid and self-pay.  [facility] [place] [address] [place] Phone: [phone]   [facility] [address] Natasha Bence [place], [place] [phone], Ext. [phone] Mondays & Thursdays: 7-9 AM.  First 15 patients are seen on a first  come, first serve basis.    Medicaid-accepting [facility] Providers:  Organization         Address  Phone   Notes  [facility] [address] [place] [address], [place] [phone] Also accepts self-pay patients.  [facility] [address] Laurell Josephs [place], [place]  [phone]   [facility] [address], [place] [phone]   [facility] [address] [place] [phone]   Renaye Rakers [address], [place]   [phone] Only accepts [place] patients after they have their name applied to their card.   Self-Pay (no insurance) in [facility]:  Organization         Address  Phone   Notes  Sickle Cell Patients, [facility] [address] [place], [place] [phone]   [facility] [address] [place], [place] [phone]   [facility] [place]  [address], [place] [phone]   [facility]/Dr. Osei-Bonsu  [address], [place] or [address], [place] [phone] Phone number for both [place] and [place] locations is the same.  [facility] [address] [place] [phone]   [facility] [address] [place] or [address] [phone]   [facility] [address], [place] [phone], phone; [phone], fax Sees patients 1st and 3rd Saturday of every month.  Must not qualify for public or private insurance (i.e. Medicaid, Medicare, [place] Health Choice, Veterans' Benefits)  Household income should be no more than 200% of the poverty level The clinic cannot treat you if you are pregnant or think you are pregnant  Sexually transmitted diseases are not treated at the clinic.    Dental Care: Organization          Address  Phone  Notes  [facility] of [facility] [address] [place], [place] [phone] Accepts children up to age 44 who are enrolled in [place] or [place] Health Choice; pregnant women with a Medicaid card; and children who have applied for Medicaid or [place] Health Choice, but were  declined, whose parents can pay a reduced fee at time of service.  [facility] Department of [facility]  [address], [place] [phone] Accepts children up to age 63 who are enrolled in [place] or [place] Health Choice; pregnant women with a Medicaid card; and children who have applied for Medicaid or [place] Health Choice, but were declined, whose parents can pay a reduced fee at time of service.  [facility]  [address] [place], [place] [phone] Patients are seen by appointment only. Walk-ins are not accepted. [facility] will see patients 50 years of age and older. Monday - Tuesday (8am-5pm) Most Wednesdays (8:30-5pm) $30 per visit, cash only  [facility]  [address], [place] [facility] [phone] Patients are seen by appointment only. Walk-ins are not accepted. [facility] will see patients 63 years of age and older. One Wednesday Evening (Monthly: Volunteer Based).  $30 per visit, cash only  [employer]  [phone] for adults; Children under age 65, call Graduate Pediatric Dentistry at [phone]. Children aged 49-14, please call [phone] to request a pediatric application.  Dental services are provided in all areas of dental care including fillings, crowns and bridges, complete and partial dentures, implants, gum treatment, root canals, and extractions. Preventive care is also provided. Treatment is provided to both adults and children. Patients are selected via a lottery and there is often a waiting list.   [facility] [address] [place]  [phone] [email or web address]   [facility] [address] [place] [address] [place], [place] [phone], Ext. [phone] Second and Fourth Thursday of each month, opens at 6:30 AM; Clinic ends at 9 AM.  Patients are seen on a first-come first-served basis, and a limited number are seen during each clinic.   [facility]  [address] Ether Griffins [place], [place] [phone]   Eligibility Requirements You must have lived in [place], [place], or [place] counties for at least the last three months.   You cannot be eligible for state or federal sponsored [place], including [employer], [place], or [employer].   You generally cannot be eligible for healthcare insurance through your employer.    How to apply: Eligibility screenings are held every Tuesday and Wednesday afternoon from 1:00 pm until 4:00 pm. You do not need an appointment for the interview!  [facility] [address], [place], [place] [phone]   [facility] Department  [phone]   [facility] Health Department  [phone]   [facility] Health Department  [phone]

## 2014-12-21 NOTE — ED Provider Notes (Signed)
CSN: [id number]     Arrival date & time 12/20/14  2342 History   First MD Initiated Contact with Patient 12/21/14 0011     Chief Complaint  Patient presents with  . Back Pain     (Consider location/radiation/quality/duration/timing/severity/associated sxs/prior Treatment) HPI   34 year old female presents for evaluation of back injury. Patient report around 2:00 today, she was trying to get out for [employer] truck and she fell backward striking up back against the curb. She complained of severe onset of sharp throbbing pain to the low back, nonradiating, persistent, worsening with movement. Patient states since then she has not been able to walk due to pain, denies any radicular pain numbness or weakness. She has tried taking ibuprofen with minimal improvement. She denies hitting head or loss of consciousness. No complaint of chest pain or abdominal pain. No complaint of hematuria. Patient is not pregnant. Her last menstrual period was 2 days ago.  Past Medical History  Diagnosis Date  . Pancreatitis   . Endometriosis 2013  . Thyroid disease   . Endometrial polyp   . Anemia    Past Surgical History  Procedure Laterality Date  . Cholecystectomy    . Tubal ligation     Family History  Problem Relation Age of Onset  . Hypertension Father   . Diabetes Father   . Breast cancer Mother   . Hyperlipidemia Mother   . Hypertension Mother    History  Substance Use Topics  . Smoking status: Current Every Day Smoker -- 0.25 packs/day    Types: Cigarettes  . Smokeless tobacco: Not on file  . Alcohol Use: Yes     Comment: occas.   OB History    Gravida Para Term Preterm AB TAB SAB Ectopic Multiple Living   6 6 1 5      6      Review of Systems  Constitutional: Negative for fever.  Gastrointestinal: Negative for abdominal pain.  Musculoskeletal: Positive for back pain. Negative for neck pain.  Neurological: Negative for headaches.      Allergies  Review of patient's allergies  indicates no known allergies.  Home Medications   Prior to Admission medications   Medication Sig Start Date End Date Taking? Authorizing Provider  acetaminophen (TYLENOL) 325 MG tablet Take 650 mg by mouth every 6 (six) hours as needed for mild pain.    Historical Provider, MD  dicyclomine (BENTYL) 20 MG tablet Take 1 tablet (20 mg total) by mouth 2 (two) times daily as needed for spasms. 10/24/14   Loren Raceravid Yelverton, MD  doxycycline (VIBRA-TABS) 100 MG tablet Take 1 tablet (100 mg total) by mouth every 12 (twelve) hours. Patient not taking: Reported on 10/24/2014 08/23/14   Reva Boresanya S Pratt, MD  HYDROcodone-acetaminophen (NORCO/VICODIN) 5-325 MG per tablet Take 1 tablet by mouth 2 (two) times daily as needed for severe pain. Patient not taking: Reported on 10/24/2014 03/08/14   Tomasita CrumbleAdeleke Oni, MD  ibuprofen (ADVIL,MOTRIN) 200 MG tablet Take 400 mg by mouth every 6 (six) hours as needed for mild pain.    Historical Provider, MD  megestrol (MEGACE) 40 MG tablet Take 1 tablet (40 mg total) by mouth 2 (two) times daily. Patient not taking: Reported on 10/24/2014 08/23/14   Reva Boresanya S Pratt, MD  ondansetron (ZOFRAN ODT) 4 MG disintegrating tablet 4mg  ODT q4 hours prn nausea/vomit 10/24/14   Loren Raceravid Yelverton, MD  traMADol (ULTRAM) 50 MG tablet Take 1 tablet (50 mg total) by mouth every 6 (six) hours as needed for  moderate pain. 10/24/14   Loren Racer, MD   BP 150/108 mmHg  Pulse 101  Temp(Src) 98.4 F (36.9 C) (Oral)  Resp 22  SpO2 96%  LMP 12/19/2014 Physical Exam  Constitutional: She is oriented to person, place, and time. She appears well-developed and well-nourished. No distress.  Obese female, tearful, laying on her side.  HENT:  Head: Normocephalic and atraumatic.  Eyes: Conjunctivae are normal.  Neck: Normal range of motion. Neck supple.  No cervical midline spine tenderness, crepitus, or step-off  Cardiovascular: Normal rate and regular rhythm.   Pulmonary/Chest: Effort normal and breath sounds  normal.  Abdominal: Soft. There is no tenderness.  Musculoskeletal: She exhibits tenderness (tenderness to lumbar and lumbar paraspinal muscles on palpation with overlying skin abrasion but no crepitus or step-off. No ecchymosis.).  Neurological: She is alert and oriented to person, place, and time.  Intact patellar deep tendon reflex bilaterally.  Skin: No rash noted.  Psychiatric: She has a normal mood and affect.  Nursing note and vitals reviewed.   ED Course  Procedures (including critical care time)  Patient report a mechanical fall off a [employer] truck 10 hours ago and here with worsening low back pain.  Will obtain xray to r/o acute fx.  Low suspicion for kidney or internal injuries.  Pt currently on her Menstrual period.  No abd pain.  No radicular pain.  Pain medication given.    12:45 AM Care discussed with oncoming provider who will reevaluate pt after xray and determine disposition.  Labs Review Labs Reviewed  URINALYSIS, ROUTINE W REFLEX MICROSCOPIC (NOT AT [facility])  I-STAT BETA HCG BLOOD, ED ([facility], [facility], [facility] ONLY)    Imaging Review No results found.   EKG Interpretation None      MDM   Final diagnoses:  None    BP 150/108 mmHg  Pulse 101  Temp(Src) 98.4 F (36.9 C) (Oral)  Resp 22  SpO2 96%  LMP 12/19/2014     Fayrene Helper, PA-C 12/21/14 9562  Tomasita Crumble, MD 12/21/14 [phone]

## 2015-06-27 ENCOUNTER — Emergency Department (HOSPITAL_COMMUNITY)
Admission: EM | Admit: 2015-06-27 | Discharge: 2015-06-27 | Disposition: A | Discharge status: Home / Self Care | Payer: Self-pay | Attending: Emergency Medicine | Admitting: Emergency Medicine

## 2015-06-27 ENCOUNTER — Encounter (HOSPITAL_COMMUNITY): Payer: Self-pay | Admitting: Emergency Medicine

## 2015-06-27 DIAGNOSIS — R110 Nausea: Secondary | ICD-10-CM | POA: Insufficient documentation

## 2015-06-27 DIAGNOSIS — M542 Cervicalgia: Secondary | ICD-10-CM | POA: Insufficient documentation

## 2015-06-27 DIAGNOSIS — Z87891 Personal history of nicotine dependence: Secondary | ICD-10-CM | POA: Insufficient documentation

## 2015-06-27 DIAGNOSIS — Z791 Long term (current) use of non-steroidal anti-inflammatories (NSAID): Secondary | ICD-10-CM | POA: Insufficient documentation

## 2015-06-27 DIAGNOSIS — K089 Disorder of teeth and supporting structures, unspecified: Secondary | ICD-10-CM | POA: Insufficient documentation

## 2015-06-27 DIAGNOSIS — Z8742 Personal history of other diseases of the female genital tract: Secondary | ICD-10-CM | POA: Insufficient documentation

## 2015-06-27 DIAGNOSIS — R55 Syncope and collapse: Secondary | ICD-10-CM | POA: Insufficient documentation

## 2015-06-27 DIAGNOSIS — Z79899 Other long term (current) drug therapy: Secondary | ICD-10-CM | POA: Insufficient documentation

## 2015-06-27 DIAGNOSIS — Z862 Personal history of diseases of the blood and blood-forming organs and certain disorders involving the immune mechanism: Secondary | ICD-10-CM | POA: Insufficient documentation

## 2015-06-27 DIAGNOSIS — K029 Dental caries, unspecified: Secondary | ICD-10-CM | POA: Insufficient documentation

## 2015-06-27 DIAGNOSIS — K0262 Dental caries on smooth surface penetrating into dentin: Secondary | ICD-10-CM

## 2015-06-27 DIAGNOSIS — R51 Headache: Secondary | ICD-10-CM | POA: Insufficient documentation

## 2015-06-27 LAB — CBC
HCT: 28.3 % — ABNORMAL LOW (ref 36.0–46.0)
Hemoglobin: 8.2 g/dL — ABNORMAL LOW (ref 12.0–15.0)
MCH: 20.2 pg — ABNORMAL LOW (ref 26.0–34.0)
MCHC: 29 g/dL — ABNORMAL LOW (ref 30.0–36.0)
MCV: 69.9 fL — AB (ref 78.0–100.0)
PLATELETS: 309 10*3/uL (ref 150–400)
RBC: 4.05 MIL/uL (ref 3.87–5.11)
RDW: 16.2 % — ABNORMAL HIGH (ref 11.5–15.5)
WBC: 4.9 10*3/uL (ref 4.0–10.5)

## 2015-06-27 LAB — BASIC METABOLIC PANEL
Anion gap: 9 (ref 5–15)
BUN: 10 mg/dL (ref 6–20)
CO2: 25 mmol/L (ref 22–32)
Calcium: 9.2 mg/dL (ref 8.9–10.3)
Chloride: 107 mmol/L (ref 101–111)
Creatinine, Ser: 0.65 mg/dL (ref 0.44–1.00)
GFR calc non Af Amer: >60 mL/min (ref >60)
Glucose, Bld: 98 mg/dL (ref 65–99)
Potassium: 3.6 mmol/L (ref 3.5–5.1)
SODIUM: 141 mmol/L (ref 135–145)

## 2015-06-27 LAB — CBG MONITORING, ED: Glucose-Capillary: 89 mg/dL (ref 65–99)

## 2015-06-27 MED ORDER — IBUPROFEN 600 MG PO TABS: 600.0000 mg | ORAL_TABLET | Freq: Four times a day (QID) | ORAL | Status: AC | PRN

## 2015-06-27 MED ORDER — PENICILLIN V POTASSIUM 500 MG PO TABS: 500.0000 mg | ORAL_TABLET | Freq: Four times a day (QID) | ORAL | Status: AC

## 2015-06-27 NOTE — ED Provider Notes (Addendum)
CSN: [id number]     Arrival date & time 06/27/15  1018 History   First MD Initiated Contact with Patient 06/27/15 1053     Chief Complaint  Patient presents with  . Loss of Consciousness  . Neck Pain  . Jaw Pain     (Consider location/radiation/quality/duration/timing/severity/associated sxs/prior Treatment) Patient is a 35 y.o. female presenting with syncope and neck pain. The history is provided by the patient.  Loss of Consciousness Episode history:  Single Most recent episode:  Yesterday Duration:  5 minutes Timing:  Sporadic Progression:  Resolved Chronicity:  New Context comment:  Right jaw pain, left lateral neck pain Witnessed: yes   Relieved by:  Nothing Worsened by:  Nothing tried Ineffective treatments:  None tried Associated symptoms: headaches (right temple and jaw) and nausea   Associated symptoms: no fever, no recent fall, no shortness of breath and no vomiting   Neck Pain Pain location:  L side Quality:  Aching Pain radiates to:  Does not radiate Pain severity:  Moderate Onset quality:  Gradual Duration:  1 week Timing:  Constant Progression:  Worsening Chronicity:  New Relieved by:  Nothing Worsened by:  Nothing tried Ineffective treatments:  None tried Associated symptoms: headaches (right temple and jaw) and syncope   Associated symptoms: no fever     Past Medical History  Diagnosis Date  . Pancreatitis   . Endometriosis 2013  . Endometrial polyp   . Anemia    Past Surgical History  Procedure Laterality Date  . Cholecystectomy    . Tubal ligation     Family History  Problem Relation Age of Onset  . Hypertension Father   . Diabetes Father   . Breast cancer Mother   . Hyperlipidemia Mother   . Hypertension Mother    Social History  Substance Use Topics  . Smoking status: Former Smoker -- 0.25 packs/day    Quit date: 05/27/2015  . Smokeless tobacco: None  . Alcohol Use: Yes     Comment: occas.   OB History    Gravida Para Term  Preterm AB TAB SAB Ectopic Multiple Living   Review of Systems  Constitutional: Negative for fever.  Respiratory: Negative for shortness of breath.   Cardiovascular: Positive for syncope.  Gastrointestinal: Positive for nausea. Negative for vomiting.  Musculoskeletal: Positive for neck pain.  Neurological: Positive for headaches (right temple and jaw).  All other systems reviewed and are negative.     Allergies  Review of patient's allergies indicates no known allergies.  Home Medications   Prior to Admission medications   Medication Sig Start Date End Date Taking? Authorizing Provider  dicyclomine (BENTYL) 20 MG tablet Take 1 tablet (20 mg total) by mouth 2 (two) times daily as needed for spasms. Patient not taking: Reported on 12/21/2014 10/24/14   Loren Racer, MD  doxycycline (VIBRA-TABS) 100 MG tablet Take 1 tablet (100 mg total) by mouth every 12 (twelve) hours. Patient not taking: Reported on 10/24/2014 08/23/14   Reva Bores, MD  HYDROcodone-acetaminophen (NORCO/VICODIN) 5-325 MG per tablet Take 1-2 tablets by mouth every 8 (eight) hours as needed for severe pain. 12/21/14   Antony Madura, PA-C  ibuprofen (ADVIL,MOTRIN) 200 MG tablet Take 400 mg by mouth every 6 (six) hours as needed for mild pain.    Historical Provider, MD  methocarbamol (ROBAXIN) 500 MG tablet Take 1 tablet (500 mg total) by mouth 2 (two) times daily.  12/21/14   Antony Madura, PA-C  naproxen (NAPROSYN) 500 MG tablet Take 1 tablet (500 mg total) by mouth 2 (two) times daily. 12/21/14   Antony Madura, PA-C  ondansetron (ZOFRAN ODT) 4 MG disintegrating tablet  ODT q4 hours prn nausea/vomit Patient not taking: Reported on 12/21/2014 10/24/14   Loren Racer, MD  traMADol (ULTRAM) 50 MG tablet Take 1 tablet (50 mg total) by mouth every 6 (six) hours as needed for moderate pain. Patient not taking: Reported on 12/21/2014 10/24/14   Loren Racer, MD   BP 120/71 mmHg  Pulse 88  Temp(Src) 97.9  F (36.6 C) (Oral)  Resp 18  Ht  (1.753 m)  Wt 195 lb (88.451 kg)  BMI 28.78 kg/m2  SpO2 100%  LMP 06/06/2015 Physical Exam  Constitutional: She is oriented to person, place, and time. She appears well-developed and well-nourished. No distress.  HENT:  Head: Normocephalic.  Mouth/Throat: Uvula is midline, oropharynx is clear and moist and mucous membranes are normal. Abnormal dentition. Dental caries present.    Tender to palpation over right mandible  Eyes: Conjunctivae are normal.  Neck: Neck supple. No tracheal deviation present.  Cardiovascular: Normal rate and regular rhythm.   Pulmonary/Chest: Effort normal. No respiratory distress.  Abdominal: Soft. She exhibits no distension.  Musculoskeletal:  Tender to palpation over left cervical paraspinal musculature and trapezius  Neurological: She is alert and oriented to person, place, and time. She has normal strength. Coordination and gait normal. GCS eye subscore is 4. GCS verbal subscore is 5. GCS motor subscore is 6.  Skin: Skin is warm and dry.  Psychiatric: She has a normal mood and affect.    ED Course  Procedures (including critical care time) Labs Review Labs Reviewed  CBC - Abnormal; Notable for the following:    Hemoglobin 8.2 (*)    HCT 28.3 (*)    MCV 69.9 (*)    MCH 20.2 (*)    MCHC 29.0 (*)    RDW 16.2 (*)    All other components within normal limits  BASIC METABOLIC PANEL  CBG MONITORING, ED    Imaging Review No results found. I have personally reviewed and evaluated these images and lab results as part of my medical decision-making.   EKG Interpretation   Date/Time:  Friday June 27 2015 10:37:41 EST Ventricular Rate:  83 PR Interval:  144 QRS Duration: 82 QT Interval:  382 QTC Calculation: 448 R Axis:   12 Text Interpretation:  Normal sinus rhythm Normal ECG No significant change  since last tracing Confirmed by Robynne Roat MD, Deandra Goering ([id number]) on 06/27/2015  11:00:38 AM      MDM    Final diagnoses:  Neck pain on left side  Dental caries extending into dentin  Syncope and collapse    35 y.o. female presents with ongoing left lateral neck pain that started insidiously 2 weeks ago. She is tender palpation over the muscles in the area and clinically she has a neck strain. She has a secondary complaint of right jaw pain and swelling with poor dentition and a large defect over her right lower tooth that is concerning for developing abscess. She pressed on this yesterday and after feeling a rush of pain go up to her head she felt lightheaded, lost consciousness briefly and recovered spontaneously without recurrence. EKG interpreted by me without ST or T wave changes concerning for myocardial ischemia. No delta wave, no prolonged QTc, no brugada to suggest arrhythmogenicity.  No significant hematologic or metabolic abnormalities  to explain symptoms. Low risk episode with known history of anemia that appears unchanged from prior levels, no indication for transfusion currently, microcytic in nature so would recommend iron supplementation. Currently suspect she had a vagal episode related to her dental pain from dental caries. Plan for antimicrobial therapy to help with dental symptoms, patient needs to follow up with dentistry for completion of therapy. Plan to schedule NSAIDs. Patient needs to establish primary care in the area and was provided contact information to do so.     Lyndal Pulley, MD 06/27/15 [phone]

## 2015-06-27 NOTE — ED Notes (Signed)
Patient states passed out yesterday when walking.   Patient states room was spinning off and on and she had a slight headache.   Patient states is having R jaw pain and states that she had a crown come off x 1 year and thinks something is infected.  Patient states also having neck pain on left side of neck and thinks it's lymph swelling.

## 2015-06-27 NOTE — Discharge Instructions (Signed)
Dental Care and Dentist Visits Dental care supports good overall health. Regular dental visits can also help you avoid dental pain, bleeding, infection, and other more serious health problems in the future. It is important to keep the mouth healthy because diseases in the teeth, gums, and other oral tissues can spread to other areas of the body. Some problems, such as diabetes, heart disease, and pre-term labor have been associated with poor oral health.  See your dentist every 6 months. If you experience emergency problems such as a toothache or broken tooth, go to the dentist right away. If you see your dentist regularly, you may catch problems early. It is easier to be treated for problems in the early stages.  WHAT TO EXPECT AT A DENTIST VISIT  Your dentist will look for many common oral health problems and recommend proper treatment. At your regular dental visit, you can expect:  Gentle cleaning of the teeth and gums. This includes scraping and polishing. This helps to remove the sticky substance around the teeth and gums (plaque). Plaque forms in the mouth shortly after eating. Over time, plaque hardens on the teeth as tartar. If tartar is not removed regularly, it can cause problems. Cleaning also helps remove stains.  Periodic X-rays. These pictures of the teeth and supporting bone will help your dentist assess the health of your teeth.  Periodic fluoride treatments. Fluoride is a natural mineral shown to help strengthen teeth. Fluoride treatmentinvolves applying a fluoride gel or varnish to the teeth. It is most commonly done in children.  Examination of the mouth, tongue, jaws, teeth, and gums to look for any oral health problems, such as:  Cavities (dental caries). This is decay on the tooth caused by plaque, sugar, and acid in the mouth. It is best to catch a cavity when it is small.  Inflammation of the gums caused by plaque buildup (gingivitis).  Problems with the mouth or malformed  or misaligned teeth.  Oral cancer or other diseases of the soft tissues or jaws. KEEP YOUR TEETH AND GUMS HEALTHY For healthy teeth and gums, follow these general guidelines as well as your dentist's specific advice:  Have your teeth professionally cleaned at the dentist every 6 months.  Brush twice daily with a fluoride toothpaste.  Floss your teeth daily.  Ask your dentist if you need fluoride supplements, treatments, or fluoride toothpaste.  Eat a healthy diet. Reduce foods and drinks with added sugar.  Avoid smoking. TREATMENT FOR ORAL HEALTH PROBLEMS If you have oral health problems, treatment varies depending on the conditions present in your teeth and gums.  Your caregiver will most likely recommend good oral hygiene at each visit.  For cavities, gingivitis, or other oral health disease, your caregiver will perform a procedure to treat the problem. This is typically done at a separate appointment. Sometimes your caregiver will refer you to another dental specialist for specific tooth problems or for surgery. SEEK IMMEDIATE DENTAL CARE IF:  You have pain, bleeding, or soreness in the gum, tooth, jaw, or mouth area.  A permanent tooth becomes loose or separated from the gum socket.  You experience a blow or injury to the mouth or jaw area.   This information is not intended to replace advice given to you by your health care provider. Make sure you discuss any questions you have with your health care provider.   Document Released: 02/03/2011 Document Revised: 08/16/2011 Document Reviewed: 02/03/2011 Elsevier Interactive Patient Education 2016 [employer]  Cervical Strain and  Sprain With Rehab Cervical strain and sprain are injuries that commonly occur with "whiplash" injuries. Whiplash occurs when the neck is forcefully whipped backward or forward, such as during a motor vehicle accident or during contact sports. The muscles, ligaments, tendons, discs, and nerves of the  neck are susceptible to injury when this occurs. RISK FACTORS Risk of having a whiplash injury increases if:  Osteoarthritis of the spine.  Situations that make head or neck accidents or trauma more likely.  High-risk sports (football, rugby, wrestling, hockey, auto racing, gymnastics, diving, contact karate, or boxing).  Poor strength and flexibility of the neck.  Previous neck injury.  Poor tackling technique.  Improperly fitted or padded equipment. SYMPTOMS   Pain or stiffness in the front or back of neck or both.  Symptoms may present immediately or up to 24 hours after injury.  Dizziness, headache, nausea, and vomiting.  Muscle spasm with soreness and stiffness in the neck.  Tenderness and swelling at the injury site. PREVENTION  Learn and use proper technique (avoid tackling with the head, spearing, and head-butting; use proper falling techniques to avoid landing on the head).  Warm up and stretch properly before activity.  Maintain physical fitness:  Strength, flexibility, and endurance.  Cardiovascular fitness.  Wear properly fitted and padded protective equipment, such as padded soft collars, for participation in contact sports. PROGNOSIS  Recovery from cervical strain and sprain injuries is dependent on the extent of the injury. These injuries are usually curable in 1 week to 3 months with appropriate treatment.  RELATED COMPLICATIONS   Temporary numbness and weakness may occur if the nerve roots are damaged, and this may persist until the nerve has completely healed.  Chronic pain due to frequent recurrence of symptoms.  Prolonged healing, especially if activity is resumed too soon (before complete recovery). TREATMENT  Treatment initially involves the use of ice and medication to help reduce pain and inflammation. It is also important to perform strengthening and stretching exercises and modify activities that worsen symptoms so the injury does not get  worse. These exercises may be performed at home or with a therapist. For patients who experience severe symptoms, a soft, padded collar may be recommended to be worn around the neck.  Improving your posture may help reduce symptoms. Posture improvement includes pulling your chin and abdomen in while sitting or standing. If you are sitting, sit in a firm chair with your buttocks against the back of the chair. While sleeping, try replacing your pillow with a small towel rolled to 2 inches in diameter, or use a cervical pillow or soft cervical collar. Poor sleeping positions delay healing.  For patients with nerve root damage, which causes numbness or weakness, the use of a cervical traction apparatus may be recommended. Surgery is rarely necessary for these injuries. However, cervical strain and sprains that are present at birth (congenital) may require surgery. MEDICATION   If pain medication is necessary, nonsteroidal anti-inflammatory medications, such as aspirin and ibuprofen, or other minor pain relievers, such as acetaminophen, are often recommended.  Do not take pain medication for 7 days before surgery.  Prescription pain relievers may be given if deemed necessary by your caregiver. Use only as directed and only as much as you need. HEAT AND COLD:   Cold treatment (icing) relieves pain and reduces inflammation. Cold treatment should be applied for 10 to 15 minutes every 2 to 3 hours for inflammation and pain and immediately after any activity that aggravates your symptoms. Use  ice packs or an ice massage.  Heat treatment may be used prior to performing the stretching and strengthening activities prescribed by your caregiver, physical therapist, or athletic trainer. Use a heat pack or a warm soak. SEEK MEDICAL CARE IF:   Symptoms get worse or do not improve in 2 weeks despite treatment.  New, unexplained symptoms develop (drugs used in treatment may produce side effects). EXERCISES RANGE OF  MOTION (ROM) AND STRETCHING EXERCISES - Cervical Strain and Sprain These exercises may help you when beginning to rehabilitate your injury. In order to successfully resolve your symptoms, you must improve your posture. These exercises are designed to help reduce the forward-head and rounded-shoulder posture which contributes to this condition. Your symptoms may resolve with or without further involvement from your physician, physical therapist or athletic trainer. While completing these exercises, remember:   Restoring tissue flexibility helps normal motion to return to the joints. This allows healthier, less painful movement and activity.  An effective stretch should be held for at least 20 seconds, although you may need to begin with shorter hold times for comfort.  A stretch should never be painful. You should only feel a gentle lengthening or release in the stretched tissue. STRETCH- Axial Extensors  Lie on your back on the floor. You may bend your knees for comfort. Place a rolled-up hand towel or dish towel, about 2 inches in diameter, under the part of your head that makes contact with the floor.  Gently tuck your chin, as if trying to make a "double chin," until you feel a gentle stretch at the base of your head.  Hold __________ seconds. Repeat __________ times. Complete this exercise __________ times per day.  STRETCH - Axial Extension   Stand or sit on a firm surface. Assume a good posture: chest up, shoulders drawn back, abdominal muscles slightly tense, knees unlocked (if standing) and feet hip width apart.  Slowly retract your chin so your head slides back and your chin slightly lowers. Continue to look straight ahead.  You should feel a gentle stretch in the back of your head. Be certain not to feel an aggressive stretch since this can cause headaches later.  Hold for __________ seconds. Repeat __________ times. Complete this exercise __________ times per day. STRETCH -  Cervical Side Bend   Stand or sit on a firm surface. Assume a good posture: chest up, shoulders drawn back, abdominal muscles slightly tense, knees unlocked (if standing) and feet hip width apart.  Without letting your nose or shoulders move, slowly tip your right / left ear to your shoulder until your feel a gentle stretch in the muscles on the opposite side of your neck.  Hold __________ seconds. Repeat __________ times. Complete this exercise __________ times per day. STRETCH - Cervical Rotators   Stand or sit on a firm surface. Assume a good posture: chest up, shoulders drawn back, abdominal muscles slightly tense, knees unlocked (if standing) and feet hip width apart.  Keeping your eyes level with the ground, slowly turn your head until you feel a gentle stretch along the back and opposite side of your neck.  Hold __________ seconds. Repeat __________ times. Complete this exercise __________ times per day. RANGE OF MOTION - Neck Circles   Stand or sit on a firm surface. Assume a good posture: chest up, shoulders drawn back, abdominal muscles slightly tense, knees unlocked (if standing) and feet hip width apart.  Gently roll your head down and around from the back of one  shoulder to the back of the other. The motion should never be forced or painful.  Repeat the motion 10-20 times, or until you feel the neck muscles relax and loosen. Repeat __________ times. Complete the exercise __________ times per day. STRENGTHENING EXERCISES - Cervical Strain and Sprain These exercises may help you when beginning to rehabilitate your injury. They may resolve your symptoms with or without further involvement from your physician, physical therapist, or athletic trainer. While completing these exercises, remember:   Muscles can gain both the endurance and the strength needed for everyday activities through controlled exercises.  Complete these exercises as instructed by your physician, physical  therapist, or athletic trainer. Progress the resistance and repetitions only as guided.  You may experience muscle soreness or fatigue, but the pain or discomfort you are trying to eliminate should never worsen during these exercises. If this pain does worsen, stop and make certain you are following the directions exactly. If the pain is still present after adjustments, discontinue the exercise until you can discuss the trouble with your clinician. STRENGTH - Cervical Flexors, Isometric  Face a wall, standing about 6 inches away. Place a small pillow, a ball about 6-8 inches in diameter, or a folded towel between your forehead and the wall.  Slightly tuck your chin and gently push your forehead into the soft object. Push only with mild to moderate intensity, building up tension gradually. Keep your jaw and forehead relaxed.  Hold 10 to 20 seconds. Keep your breathing relaxed.  Release the tension slowly. Relax your neck muscles completely before you start the next repetition. Repeat __________ times. Complete this exercise __________ times per day. STRENGTH- Cervical Lateral Flexors, Isometric   Stand about 6 inches away from a wall. Place a small pillow, a ball about 6-8 inches in diameter, or a folded towel between the side of your head and the wall.  Slightly tuck your chin and gently tilt your head into the soft object. Push only with mild to moderate intensity, building up tension gradually. Keep your jaw and forehead relaxed.  Hold 10 to 20 seconds. Keep your breathing relaxed.  Release the tension slowly. Relax your neck muscles completely before you start the next repetition. Repeat __________ times. Complete this exercise __________ times per day. STRENGTH - Cervical Extensors, Isometric   Stand about 6 inches away from a wall. Place a small pillow, a ball about 6-8 inches in diameter, or a folded towel between the back of your head and the wall.  Slightly tuck your chin and  gently tilt your head back into the soft object. Push only with mild to moderate intensity, building up tension gradually. Keep your jaw and forehead relaxed.  Hold 10 to 20 seconds. Keep your breathing relaxed.  Release the tension slowly. Relax your neck muscles completely before you start the next repetition. Repeat __________ times. Complete this exercise __________ times per day. POSTURE AND BODY MECHANICS CONSIDERATIONS - Cervical Strain and Sprain Keeping correct posture when sitting, standing or completing your activities will reduce the stress put on different body tissues, allowing injured tissues a chance to heal and limiting painful experiences. The following are general guidelines for improved posture. Your physician or physical therapist will provide you with any instructions specific to your needs. While reading these guidelines, remember:  The exercises prescribed by your provider will help you have the flexibility and strength to maintain correct postures.  The correct posture provides the optimal environment for your joints to work. All of  your joints have less wear and tear when properly supported by a spine with good posture. This means you will experience a healthier, less painful body.  Correct posture must be practiced with all of your activities, especially prolonged sitting and standing. Correct posture is as important when doing repetitive low-stress activities (typing) as it is when doing a single heavy-load activity (lifting). PROLONGED STANDING WHILE SLIGHTLY LEANING FORWARD When completing a task that requires you to lean forward while standing in one place for a long time, place either foot up on a stationary 2- to 4-inch high object to help maintain the best posture. When both feet are on the ground, the low back tends to lose its slight inward curve. If this curve flattens (or becomes too large), then the back and your other joints will experience too much stress,  fatigue more quickly, and can cause pain.  RESTING POSITIONS Consider which positions are most painful for you when choosing a resting position. If you have pain with flexion-based activities (sitting, bending, stooping, squatting), choose a position that allows you to rest in a less flexed posture. You would want to avoid curling into a fetal position on your side. If your pain worsens with extension-based activities (prolonged standing, working overhead), avoid resting in an extended position such as sleeping on your stomach. Most people will find more comfort when they rest with their spine in a more neutral position, neither too rounded nor too arched. Lying on a non-sagging bed on your side with a pillow between your knees, or on your back with a pillow under your knees will often provide some relief. Keep in mind, being in any one position for a prolonged period of time, no matter how correct your posture, can still lead to stiffness. WALKING Walk with an upright posture. Your ears, shoulders, and hips should all line up. OFFICE WORK When working at a desk, create an environment that supports good, upright posture. Without extra support, muscles fatigue and lead to excessive strain on joints and other tissues. CHAIR:  A chair should be able to slide under your desk when your back makes contact with the back of the chair. This allows you to work closely.  The chair's height should allow your eyes to be level with the upper part of your monitor and your hands to be slightly lower than your elbows.  Body position:  Your feet should make contact with the floor. If this is not possible, use a foot rest.  Keep your ears over your shoulders. This will reduce stress on your neck and low back.   This information is not intended to replace advice given to you by your health care provider. Make sure you discuss any questions you have with your health care provider.   Document Released: 05/24/2005  Document Revised: 06/14/2014 Document Reviewed: 09/05/2008 Elsevier Interactive Patient Education [employer]

## 2015-08-31 ENCOUNTER — Inpatient Hospital Stay (HOSPITAL_COMMUNITY)
Admission: AD | Admit: 2015-08-31 | Discharge: 2015-09-01 | Disposition: A | Discharge status: Home / Self Care | Payer: Self-pay | Source: Ambulatory Visit | Attending: Family Medicine | Admitting: Family Medicine

## 2015-08-31 DIAGNOSIS — N939 Abnormal uterine and vaginal bleeding, unspecified: Secondary | ICD-10-CM | POA: Insufficient documentation

## 2015-08-31 DIAGNOSIS — Z5321 Procedure and treatment not carried out due to patient leaving prior to being seen by health care provider: Secondary | ICD-10-CM | POA: Insufficient documentation

## 2015-08-31 NOTE — MAU Note (Signed)
Not in lobby

## 2015-08-31 NOTE — MAU Note (Signed)
Pt presents complaining of vaginal bleeding x9 days that is getting heavier and fatigue. Denies other discharge. LMP 3/17. Hx of heavy periods.

## 2015-09-01 NOTE — MAU Note (Signed)
Not in lobby

## 2015-09-01 NOTE — MAU Note (Signed)
Not in lobby x3. Left AMA

## 2017-06-15 ENCOUNTER — Encounter (HOSPITAL_COMMUNITY): Payer: Self-pay

## 2021-05-03 ENCOUNTER — Emergency Department (HOSPITAL_COMMUNITY): Payer: Medicaid Other

## 2021-05-03 ENCOUNTER — Encounter (HOSPITAL_COMMUNITY): Payer: Self-pay | Admitting: *Deleted

## 2021-05-03 ENCOUNTER — Emergency Department (HOSPITAL_COMMUNITY)
Admission: EM | Admit: 2021-05-03 | Discharge: 2021-05-04 | Disposition: A | Discharge status: Home / Self Care | Payer: Medicaid Other | Attending: Emergency Medicine | Admitting: Emergency Medicine

## 2021-05-03 ENCOUNTER — Other Ambulatory Visit: Payer: Self-pay

## 2021-05-03 DIAGNOSIS — Z202 Contact with and (suspected) exposure to infections with a predominantly sexual mode of transmission: Secondary | ICD-10-CM | POA: Insufficient documentation

## 2021-05-03 DIAGNOSIS — M25562 Pain in left knee: Secondary | ICD-10-CM | POA: Diagnosis not present

## 2021-05-03 DIAGNOSIS — M25561 Pain in right knee: Secondary | ICD-10-CM | POA: Diagnosis not present

## 2021-05-03 DIAGNOSIS — Z87891 Personal history of nicotine dependence: Secondary | ICD-10-CM | POA: Insufficient documentation

## 2021-05-03 LAB — URINALYSIS, ROUTINE W REFLEX MICROSCOPIC
Bilirubin Urine: NEGATIVE
Glucose, UA: NEGATIVE mg/dL
Ketones, ur: NEGATIVE mg/dL
Nitrite: NEGATIVE
Protein, ur: NEGATIVE mg/dL
Specific Gravity, Urine: 1.009 (ref 1.005–1.030)
pH: 5 (ref 5.0–8.0)

## 2021-05-03 MED ORDER — AZITHROMYCIN 250 MG PO TABS
1000.0000 mg | ORAL_TABLET | Freq: Once | ORAL | Status: AC
  Administered 2021-05-04: 01:00:00 1000 mg via ORAL
  Filled 2021-05-03: qty 4

## 2021-05-03 MED ORDER — CEFTRIAXONE SODIUM 500 MG IJ SOLR
500.0000 mg | Freq: Once | INTRAMUSCULAR | Status: AC
  Administered 2021-05-04: 01:00:00 500 mg via INTRAMUSCULAR
  Filled 2021-05-03: qty 500

## 2021-05-03 NOTE — ED Notes (Addendum)
Pt refused HIV testing and HCG stating she is not pregnant, had her tubes tied

## 2021-05-03 NOTE — ED Triage Notes (Signed)
The pt reports bi-lateral knee swelling for one month  and she wants to be tested for std  her boyfriend  just diagnosed with gc and chal    lmp 2 weeks ago

## 2021-05-03 NOTE — ED Provider Notes (Signed)
Emergency Medicine Provider Triage Evaluation Note  Phyllis Wilkins , a 40 y.o. female  was evaluated in triage.  Pt complains of STD screening and bilateral knee pain.  Has had pain to bilateral knees for months.  Worse with ambulation.  Notes and swelling however no redness or warmth.  No recent trauma or injury.  Also notes that her boyfriend told her he tested positive for gonorrhea and chlamydia.  She does note a malodorous vaginal discharge which began over the last few days.  No pelvic pain, denies chance of pregnancy.  No fever  Review of Systems  Positive: vaginal discharge, bilateral knee pain Negative: Fever, red knee, warmth  Physical Exam  BP (!) 158/94 (BP Location: Left Arm)   Pulse (!) 101   Temp 99.4 F (37.4 C)   Resp 18   Ht 5\' 9"  (1.753 m)   Wt 88.5 kg   SpO2 99%   BMI 28.81 kg/m  Gen:   Awake, no distress   Resp:  Normal effort  MSK:   Moves extremities without difficulty  Other:    Medical Decision Making  Medically screening exam initiated at 7:07 PM.  Appropriate orders placed.  Alissa Sitts was informed that the remainder of the evaluation will be completed by another provider, this initial triage assessment does not replace that evaluation, and the importance of remaining in the ED until their evaluation is complete.  Knee pain, vaginal discharge   Mariyah Upshaw A, PA-C 05/03/21 1909    05/05/21, MD 05/03/21 2106

## 2021-05-04 MED ORDER — MELOXICAM 7.5 MG PO TABS: 15.0000 mg | ORAL_TABLET | Freq: Every day | ORAL | 0 refills | Status: AC

## 2021-05-04 MED ORDER — STERILE WATER FOR INJECTION IJ SOLN
INTRAMUSCULAR | Status: AC
  Filled 2021-05-04: qty 10

## 2021-05-04 NOTE — Progress Notes (Signed)
Orthopedic Tech Progress Note Patient Details:  Phyllis Wilkins January 06, 1981 [id number]  Ortho Devices Type of Ortho Device: Knee Immobilizer, Crutches Ortho Device/Splint Location: bi-lateral Ortho Device/Splint Interventions: Ordered, Application, Adjustment   Post Interventions Patient Tolerated: Well Instructions Provided: Care of device, Adjustment of device  [name] Post 05/04/2021, 1:04 AM

## 2021-05-04 NOTE — Discharge Instructions (Addendum)
Take the prescribed medication as directed. Recommend to remain abstinent for at least 7 days to let medication take effect.  If tests are positive, you will be notified but you have already been treated. Follow-up with orthopedics if ongoing issues with knees-- can call for appt. Return to the ED for new or worsening symptoms.

## 2021-05-04 NOTE — ED Provider Notes (Signed)
[facility] EMERGENCY DEPARTMENT Provider Note   CSN: [id number] Arrival date & time: 05/03/21  1756     History Chief Complaint  Patient presents with   Knee Pain   Exposure to STD    Phyllis Wilkins is a 40 y.o. female.  The history is provided by the patient and medical records.  Knee Pain Exposure to STD  40 year old female with history of anemia, endometriosis, presenting to the ED with multiple complaints.  Bilateral knee pain--states injured left knee about 2 months ago at daughters birthday party.  Since then she seems to put more weight on her right knee so now is having pain in that area.  She reports a lot of grinding sensation when walking or bending.  Also reports she has gained about 30 pounds over the past few months and is concerned this may be contributing.  She has not had any new falls or trauma in the past week.  She denies any numbness or weakness of the legs.  2.  Exposure to gonorrhea and chlamydia--states she was notified 2 days ago that partner tested positive for both.  She does report some malodorous vaginal discharge but denies any dysuria or pelvic pain.  She has no history of STD.  She is not had any new sexual partner.  Past Medical History:  Diagnosis Date   Anemia    Endometrial polyp    Endometriosis 2013   Pancreatitis     Patient Active Problem List   Diagnosis Date Noted   Chlamydia infection 08/23/2014   PID (acute pelvic inflammatory disease) 08/23/2014   Menorrhagia 08/22/2014   Anemia due to acute blood loss 08/22/2014   Lump of right breast 03/24/2012    Past Surgical History:  Procedure Laterality Date   CHOLECYSTECTOMY     TUBAL LIGATION       OB History     Gravida  6   Para  6   Term  1   Preterm  5   AB      Living  6      SAB      IAB      Ectopic      Multiple      Live Births              Family History  Problem Relation Age of Onset   Hypertension Father    Diabetes  Father    Breast cancer Mother    Hyperlipidemia Mother    Hypertension Mother     Social History   Tobacco Use   Smoking status: Former    Packs/day: 0.25    Types: Cigarettes    Quit date: 05/27/2015    Years since quitting: 5.9  Substance Use Topics   Alcohol use: Yes    Comment: occas.   Drug use: No    Home Medications Prior to Admission medications   Medication Sig Start Date End Date Taking? Authorizing Provider  meloxicam (MOBIC) 7.5 MG tablet Take 2 tablets (15 mg total) by mouth daily. 05/04/21  Yes Garlon Hatchet, PA-C  dicyclomine (BENTYL) 20 MG tablet Take 1 tablet (20 mg total) by mouth 2 (two) times daily as needed for spasms. Patient not taking: Reported on 12/21/2014 10/24/14   Loren Racer, MD  doxycycline (VIBRA-TABS) 100 MG tablet Take 1 tablet (100 mg total) by mouth every 12 (twelve) hours. Patient not taking: Reported on 10/24/2014 08/23/14   Reva Bores, MD  HYDROcodone-acetaminophen (NORCO/VICODIN) [phone]  MG per tablet Take 1-2 tablets by mouth every 8 (eight) hours as needed for severe pain. Patient not taking: Reported on 06/27/2015 12/21/14   Antony Madura, PA-C  ibuprofen (ADVIL,MOTRIN) 600 MG tablet Take 1 tablet (600 mg total) by mouth every 6 (six) hours as needed. 06/27/15   Lyndal Pulley, MD  methocarbamol (ROBAXIN) 500 MG tablet Take 1 tablet (500 mg total) by mouth 2 (two) times daily. Patient not taking: Reported on 06/27/2015 12/21/14   Antony Madura, PA-C  ondansetron (ZOFRAN ODT) 4 MG disintegrating tablet 4mg  ODT q4 hours prn nausea/vomit Patient not taking: Reported on 12/21/2014 10/24/14   10/26/14, MD  traMADol (ULTRAM) 50 MG tablet Take 1 tablet (50 mg total) by mouth every 6 (six) hours as needed for moderate pain. Patient not taking: Reported on 12/21/2014 10/24/14   10/26/14, MD    Allergies    Patient has no known allergies.  Review of Systems   Review of Systems  Genitourinary:        STD exposure   Musculoskeletal:  Positive for arthralgias.  All other systems reviewed and are negative.  Physical Exam Updated Vital Signs BP (!) 152/87 (BP Location: Right Arm)   Pulse 92   Temp 98.5 F (36.9 C) (Oral)   Resp 16   Ht 5\' 9"  (1.753 m)   Wt 88.5 kg   SpO2 100%   BMI 28.81 kg/m   Physical Exam Vitals and nursing note reviewed.  Constitutional:      Appearance: She is well-developed.  HENT:     Head: Normocephalic and atraumatic.  Eyes:     Conjunctiva/sclera: Conjunctivae normal.     Pupils: Pupils are equal, round, and reactive to light.  Cardiovascular:     Rate and Rhythm: Normal rate and regular rhythm.     Heart sounds: Normal heart sounds.  Pulmonary:     Effort: Pulmonary effort is normal.     Breath sounds: Normal breath sounds.  Abdominal:     General: Bowel sounds are normal.     Palpations: Abdomen is soft.  Musculoskeletal:        General: Normal range of motion.     Cervical back: Normal range of motion.     Comments: Bilateral knees normal in appearance without swelling or deformity, no erythema, no warmth to touch, remains ambulatory  Skin:    General: Skin is warm and dry.  Neurological:     Mental Status: She is alert and oriented to person, place, and time.    ED Results / Procedures / Treatments   Labs (all labs ordered are listed, but only abnormal results are displayed) Labs Reviewed  URINALYSIS, ROUTINE W REFLEX MICROSCOPIC - Abnormal; Notable for the following components:      Result Value   APPearance HAZY (*)    Hgb urine dipstick SMALL (*)    Leukocytes,Ua SMALL (*)    Bacteria, UA RARE (*)    All other components within normal limits  GC/CHLAMYDIA PROBE AMP ([place]) NOT AT [facility]    EKG None  Radiology DG Knee Complete 4 Views Left  Result Date: 05/03/2021 CLINICAL DATA:  Pt complains of STD screening and bilateral knee pain. Has had pain to bilateral knees for months. Worse with ambulation. Notes and swelling however no  redness or warmth. No recent trauma or injury. EXAM: LEFT KNEE - COMPLETE 4+ VIEW COMPARISON:  None. FINDINGS: No fracture or bone lesion. Knee joint normally spaced and aligned. Minor marginal osteophytes  from the medial compartment and patella. No other degenerative change. No joint effusion. Soft tissues are unremarkable. IMPRESSION: 1. No fracture or acute finding. 2. Minimal degenerative change. Electronically Signed   By: Amie Portland M.D.   On: 05/03/2021 20:12   DG Knee Complete 4 Views Right  Result Date: 05/03/2021 CLINICAL DATA:  Pt complains of STD screening and bilateral knee pain. Has had pain to bilateral knees for months. Worse with ambulation. Notes and swelling however no redness or warmth. No recent trauma or injury. EXAM: RIGHT KNEE - COMPLETE 4+ VIEW COMPARISON:  10/11/2004 FINDINGS: No fracture or bone lesion. Knee joint is normally spaced and aligned. There small marginal osteophytes from all 3 compartments. No other degenerative change. No convincing joint effusion. Soft tissues are unremarkable. IMPRESSION: 1. No fracture or acute finding. 2. Minor degenerative changes. Electronically Signed   By: Amie Portland M.D.   On: 05/03/2021 20:10    Procedures Procedures   Medications Ordered in ED Medications  azithromycin (ZITHROMAX) tablet 1,000 mg (1,000 mg Oral Given 05/04/21 0035)  cefTRIAXone (ROCEPHIN) injection 500 mg (500 mg Intramuscular Given 05/04/21 0037)  sterile water (preservative free) injection (  Given 05/04/21 0037)    ED Course  I have reviewed the triage vital signs and the nursing notes.  Pertinent labs & imaging results that were available during my care of the patient were reviewed by me and considered in my medical decision making (see chart for details).    MDM Rules/Calculators/A&P                           40 year old female here with multiple complaints.  Bilateral knee pain--had a fall about 2 months ago, some ongoing pain since then.   Also reports 30 pound weight gain recently.  She does not have any deformity, erythema, or other concerning findings on exam.  Doubt septic joint.  X-rays with degenerative changes bilaterally.  Knee sleeves were given, will start on anti-inflammatories.  She is given orthopedic follow-up.  STD exposure--partner recently informed her tested positive for gonorrhea and chlamydia.  She does report some discharge.  Formal testing has been sent.  Will treat empirically here in ED.  Advised to remain abstinent for at least 7 days to let medications take effect.  Can follow-up with GYN or health department.  Can return here for new concerns.  Final Clinical Impression(s) / ED Diagnoses Final diagnoses:  Acute pain of both knees  STD exposure    Rx / DC Orders ED Discharge Orders          Ordered    meloxicam (MOBIC) 7.5 MG tablet  Daily        05/04/21 0104             Garlon Hatchet, PA-C 05/04/21 0125    Gilda Crease, MD 05/04/21 [id number]

## 2021-11-23 ENCOUNTER — Encounter (HOSPITAL_COMMUNITY): Payer: Self-pay | Admitting: Surgery

## 2021-11-23 ENCOUNTER — Encounter (HOSPITAL_COMMUNITY): Payer: Self-pay | Admitting: Emergency Medicine

## 2021-11-23 ENCOUNTER — Emergency Department (HOSPITAL_COMMUNITY): Payer: Medicaid Other

## 2021-11-23 ENCOUNTER — Observation Stay (HOSPITAL_COMMUNITY)
Admission: EM | Admit: 2021-11-23 | Discharge: 2021-11-25 | Disposition: A | Discharge status: Home / Self Care | Payer: Medicaid Other | Attending: Surgery | Admitting: Surgery

## 2021-11-23 ENCOUNTER — Other Ambulatory Visit: Payer: Self-pay

## 2021-11-23 ENCOUNTER — Emergency Department (HOSPITAL_COMMUNITY)
Admission: EM | Admit: 2021-11-23 | Discharge: 2021-11-23 | Discharge status: Against Medical Advice | Payer: Medicaid Other | Source: Home / Self Care

## 2021-11-23 DIAGNOSIS — W19XXXA Unspecified fall, initial encounter: Secondary | ICD-10-CM | POA: Insufficient documentation

## 2021-11-23 DIAGNOSIS — S27321A Contusion of lung, unilateral, initial encounter: Secondary | ICD-10-CM | POA: Diagnosis not present

## 2021-11-23 DIAGNOSIS — Z87891 Personal history of nicotine dependence: Secondary | ICD-10-CM | POA: Diagnosis not present

## 2021-11-23 DIAGNOSIS — S0993XA Unspecified injury of face, initial encounter: Principal | ICD-10-CM | POA: Insufficient documentation

## 2021-11-23 DIAGNOSIS — Z20822 Contact with and (suspected) exposure to covid-19: Secondary | ICD-10-CM | POA: Diagnosis not present

## 2021-11-23 DIAGNOSIS — R55 Syncope and collapse: Secondary | ICD-10-CM | POA: Insufficient documentation

## 2021-11-23 DIAGNOSIS — R451 Restlessness and agitation: Secondary | ICD-10-CM | POA: Insufficient documentation

## 2021-11-23 DIAGNOSIS — Y92481 Parking lot as the place of occurrence of the external cause: Secondary | ICD-10-CM | POA: Insufficient documentation

## 2021-11-23 DIAGNOSIS — Z5321 Procedure and treatment not carried out due to patient leaving prior to being seen by health care provider: Secondary | ICD-10-CM | POA: Insufficient documentation

## 2021-11-23 DIAGNOSIS — R4182 Altered mental status, unspecified: Secondary | ICD-10-CM | POA: Insufficient documentation

## 2021-11-23 HISTORY — DX: Endometriosis, unspecified: N80.9

## 2021-11-23 LAB — CBC
HCT: 43.8 % (ref 36.0–46.0)
Hemoglobin: 14.7 g/dL (ref 12.0–15.0)
MCH: 31.9 pg (ref 26.0–34.0)
MCHC: 33.6 g/dL (ref 30.0–36.0)
MCV: 95 fL (ref 80.0–100.0)
Platelets: 236 10*3/uL (ref 150–400)
RBC: 4.61 MIL/uL (ref 3.87–5.11)
RDW: 12.4 % (ref 11.5–15.5)
WBC: 9.1 10*3/uL (ref 4.0–10.5)
nRBC: 0 % (ref 0.0–0.2)

## 2021-11-23 LAB — SAMPLE TO BLOOD BANK

## 2021-11-23 LAB — I-STAT ARTERIAL BLOOD GAS, ED
Acid-base deficit: 2 mmol/L (ref 0.0–2.0)
Bicarbonate: 23.3 mmol/L (ref 20.0–28.0)
Calcium, Ion: 1.19 mmol/L (ref 1.15–1.40)
HCT: 34 % — ABNORMAL LOW (ref 36.0–46.0)
Hemoglobin: 11.6 g/dL — ABNORMAL LOW (ref 12.0–15.0)
O2 Saturation: 100 %
Patient temperature: 98.6
Potassium: 3.6 mmol/L (ref 3.5–5.1)
Sodium: 141 mmol/L (ref 135–145)
TCO2: 25 mmol/L (ref 22–32)
pCO2 arterial: 42 mmHg (ref 32–48)
pH, Arterial: 7.352 (ref 7.35–7.45)
pO2, Arterial: 243 mmHg — ABNORMAL HIGH (ref 83–108)

## 2021-11-23 LAB — PROTIME-INR
INR: 1 (ref 0.8–1.2)
Prothrombin Time: 13 seconds (ref 11.4–15.2)

## 2021-11-23 LAB — COMPREHENSIVE METABOLIC PANEL
ALT: 24 U/L (ref 0–44)
AST: 29 U/L (ref 15–41)
Albumin: 4.1 g/dL (ref 3.5–5.0)
Alkaline Phosphatase: 64 U/L (ref 38–126)
Anion gap: 11 (ref 5–15)
BUN: 15 mg/dL (ref 6–20)
CO2: 26 mmol/L (ref 22–32)
Calcium: 9.5 mg/dL (ref 8.9–10.3)
Chloride: 108 mmol/L (ref 98–111)
Creatinine, Ser: 0.81 mg/dL (ref 0.44–1.00)
GFR, Estimated: >60 mL/min (ref >60)
Glucose, Bld: 124 mg/dL — ABNORMAL HIGH (ref 70–99)
Potassium: 4.6 mmol/L (ref 3.5–5.1)
Sodium: 145 mmol/L (ref 135–145)
Total Bilirubin: 1.1 mg/dL (ref 0.3–1.2)
Total Protein: 6.8 g/dL (ref 6.5–8.1)

## 2021-11-23 LAB — RESP PANEL BY RT-PCR (FLU A&B, COVID) ARPGX2
Influenza A by PCR: NEGATIVE
Influenza B by PCR: NEGATIVE
SARS Coronavirus 2 by RT PCR: NEGATIVE

## 2021-11-23 LAB — ETHANOL: Alcohol, Ethyl (B): <10 mg/dL (ref <10)

## 2021-11-23 LAB — LACTIC ACID, PLASMA: Lactic Acid, Venous: 4.9 mmol/L (ref 0.5–1.9)

## 2021-11-23 MED ORDER — DOCUSATE SODIUM 50 MG/5ML PO LIQD: 100.0000 mg | Freq: Two times a day (BID) | ORAL | Status: DC

## 2021-11-23 MED ORDER — FENTANYL CITRATE PF 50 MCG/ML IJ SOSY: 50.0000 ug | PREFILLED_SYRINGE | INTRAMUSCULAR | Status: DC | PRN

## 2021-11-23 MED ORDER — CHLORHEXIDINE GLUCONATE CLOTH 2 % EX PADS
6.0000 | MEDICATED_PAD | Freq: Every day | CUTANEOUS | Status: DC
Start: 2021-11-24 — End: 2021-11-25

## 2021-11-23 MED ORDER — IOHEXOL 300 MG/ML  SOLN
100.0000 mL | Freq: Once | INTRAMUSCULAR | Status: AC | PRN
  Administered 2021-11-23: 100 mL via INTRAVENOUS

## 2021-11-23 MED ORDER — ORAL CARE MOUTH RINSE
15.0000 mL | OROMUCOSAL | Status: DC
Start: 2021-11-24 — End: 2021-11-24
  Administered 2021-11-23 – 2021-11-24 (×5): 15 mL via OROMUCOSAL

## 2021-11-23 MED ORDER — PROPOFOL 1000 MG/100ML IV EMUL
0.0000 ug/kg/min | INTRAVENOUS | Status: DC
  Administered 2021-11-24 (×2): 50 ug/kg/min via INTRAVENOUS
  Filled 2021-11-23 (×3): qty 100

## 2021-11-23 MED ORDER — ENOXAPARIN SODIUM 30 MG/0.3ML IJ SOSY
30.0000 mg | PREFILLED_SYRINGE | Freq: Two times a day (BID) | INTRAMUSCULAR | Status: DC
  Administered 2021-11-24 – 2021-11-25 (×3): 30 mg via SUBCUTANEOUS
  Filled 2021-11-23 (×3): qty 0.3

## 2021-11-23 MED ORDER — SUCCINYLCHOLINE CHLORIDE 20 MG/ML IJ SOLN
INTRAMUSCULAR | Status: AC | PRN
  Administered 2021-11-23: 130 mg via INTRAVENOUS

## 2021-11-23 MED ORDER — ORAL CARE MOUTH RINSE: 15.0000 mL | OROMUCOSAL | Status: DC | PRN

## 2021-11-23 MED ORDER — FENTANYL CITRATE PF 50 MCG/ML IJ SOSY
50.0000 ug | PREFILLED_SYRINGE | INTRAMUSCULAR | Status: DC | PRN
  Administered 2021-11-23: 200 ug via INTRAVENOUS
  Administered 2021-11-24 (×2): 100 ug via INTRAVENOUS
  Filled 2021-11-23: qty 4
  Filled 2021-11-23 (×2): qty 2
  Filled 2021-11-23: qty 4
  Filled 2021-11-23: qty 2

## 2021-11-23 MED ORDER — FENTANYL CITRATE (PF) 100 MCG/2ML IJ SOLN
INTRAMUSCULAR | Status: AC
  Administered 2021-11-23: 100 ug
  Filled 2021-11-23: qty 2

## 2021-11-23 MED ORDER — SODIUM CHLORIDE 0.9 % IV SOLN: INTRAVENOUS | Status: DC

## 2021-11-23 MED ORDER — MIDAZOLAM HCL 2 MG/2ML IJ SOLN
INTRAMUSCULAR | Status: AC
  Administered 2021-11-23: 2 mg
  Filled 2021-11-23: qty 2

## 2021-11-23 MED ORDER — ETOMIDATE 2 MG/ML IV SOLN
INTRAVENOUS | Status: AC | PRN
  Administered 2021-11-23: 30 mg via INTRAVENOUS

## 2021-11-23 MED ORDER — PROPOFOL 1000 MG/100ML IV EMUL
5.0000 ug/kg/min | INTRAVENOUS | Status: DC
  Administered 2021-11-23: 20 ug/kg/min via INTRAVENOUS
  Filled 2021-11-23: qty 100

## 2021-11-23 MED ORDER — ONDANSETRON HCL 4 MG/2ML IJ SOLN: 4.0000 mg | Freq: Four times a day (QID) | INTRAMUSCULAR | Status: DC | PRN

## 2021-11-23 NOTE — ED Notes (Signed)
Pt noted to ambulate out of EMS doors with steady gait
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
## 2021-11-23 NOTE — Sedation Documentation (Signed)
Pt tubed - positive color change and breath sounds clear - 7.5tube and 23 at teeth

## 2021-11-23 NOTE — ED Notes (Addendum)
Per MD Freida Busman hold on foley for right now

## 2021-11-23 NOTE — Progress Notes (Signed)
Orthopedic Tech Progress Note Patient Details:  Phyllis Wilkins 1981-05-20 [id number]  Patient ID: [name], female   DOB: [birth year], 41 y.o.   MRN: [id number] Level I; not needed at the moment.  [name] 11/23/2021, 9:03 PM

## 2021-11-23 NOTE — ED Notes (Signed)
MD notified of critical lactic  
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
## 2021-11-23 NOTE — ED Notes (Signed)
Propofol started at 20.

## 2021-11-23 NOTE — ED Notes (Signed)
Pt back from CT - Prop increased to 80 in CT and additional 100 of fentanyl given

## 2021-11-23 NOTE — Progress Notes (Signed)
   11/23/21 2018  Clinical Encounter Type  Visited With Patient not available  Visit Type Initial;Trauma  Referral From Nurse  Consult/Referral To Chaplain   Chaplain Tery Sanfilippo responded. The patient is being attended to by the medical team. There is no support person present. Chaplain remains available to provide support as needed. This note was prepared by Deneen Harts, M.Div..  For questions please contact by phone [phone].
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
## 2021-11-23 NOTE — ED Notes (Signed)
Pt with $297 in cash

## 2021-11-23 NOTE — ED Notes (Signed)
Pt taken to CT with [place] RN

## 2021-11-23 NOTE — ED Notes (Signed)
2mg Versed given
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
## 2021-11-24 ENCOUNTER — Observation Stay (HOSPITAL_COMMUNITY): Payer: Medicaid Other

## 2021-11-24 LAB — RAPID URINE DRUG SCREEN, HOSP PERFORMED
Amphetamines: NOT DETECTED
Barbiturates: NOT DETECTED
Benzodiazepines: POSITIVE — AB
Cocaine: NOT DETECTED
Opiates: NOT DETECTED
Tetrahydrocannabinol: NOT DETECTED

## 2021-11-24 LAB — URINALYSIS, ROUTINE W REFLEX MICROSCOPIC
Bilirubin Urine: NEGATIVE
Glucose, UA: NEGATIVE mg/dL
Hgb urine dipstick: NEGATIVE
Ketones, ur: NEGATIVE mg/dL
Leukocytes,Ua: NEGATIVE
Nitrite: NEGATIVE
Protein, ur: NEGATIVE mg/dL
Specific Gravity, Urine: 1.043 — ABNORMAL HIGH (ref 1.005–1.030)
pH: 6 (ref 5.0–8.0)

## 2021-11-24 LAB — CBC
HCT: 39.9 % (ref 36.0–46.0)
Hemoglobin: 14 g/dL (ref 12.0–15.0)
MCH: 31.8 pg (ref 26.0–34.0)
MCHC: 35.1 g/dL (ref 30.0–36.0)
MCV: 90.7 fL (ref 80.0–100.0)
Platelets: 208 10*3/uL (ref 150–400)
RBC: 4.4 MIL/uL (ref 3.87–5.11)
RDW: 12.4 % (ref 11.5–15.5)
WBC: 10.3 10*3/uL (ref 4.0–10.5)
nRBC: 0 % (ref 0.0–0.2)

## 2021-11-24 LAB — BASIC METABOLIC PANEL
Anion gap: 11 (ref 5–15)
BUN: 12 mg/dL (ref 6–20)
CO2: 23 mmol/L (ref 22–32)
Calcium: 8.6 mg/dL — ABNORMAL LOW (ref 8.9–10.3)
Chloride: 106 mmol/L (ref 98–111)
Creatinine, Ser: 0.66 mg/dL (ref 0.44–1.00)
GFR, Estimated: >60 mL/min (ref >60)
Glucose, Bld: 96 mg/dL (ref 70–99)
Potassium: 3 mmol/L — ABNORMAL LOW (ref 3.5–5.1)
Sodium: 140 mmol/L (ref 135–145)

## 2021-11-24 LAB — HIV ANTIBODY (ROUTINE TESTING W REFLEX): HIV Screen 4th Generation wRfx: NONREACTIVE

## 2021-11-24 LAB — TRIGLYCERIDES: Triglycerides: 155 mg/dL — ABNORMAL HIGH (ref <150)

## 2021-11-24 LAB — MRSA NEXT GEN BY PCR, NASAL: MRSA by PCR Next Gen: NOT DETECTED

## 2021-11-24 MED ORDER — OXYCODONE HCL 5 MG PO TABS
5.0000 mg | ORAL_TABLET | Freq: Four times a day (QID) | ORAL | Status: DC | PRN
  Administered 2021-11-24 – 2021-11-25 (×4): 5 mg via ORAL
  Filled 2021-11-24 (×4): qty 1

## 2021-11-24 MED ORDER — POTASSIUM CHLORIDE 20 MEQ PO PACK: 40.0000 meq | PACK | Freq: Once | ORAL | Status: DC

## 2021-11-24 MED ORDER — DOCUSATE SODIUM 100 MG PO CAPS
100.0000 mg | ORAL_CAPSULE | Freq: Two times a day (BID) | ORAL | Status: DC
  Administered 2021-11-24 – 2021-11-25 (×2): 100 mg via ORAL
  Filled 2021-11-24 (×2): qty 1

## 2021-11-24 MED ORDER — ORAL CARE MOUTH RINSE
15.0000 mL | OROMUCOSAL | Status: DC | PRN
Start: 2021-11-24 — End: 2021-11-25

## 2021-11-24 MED ORDER — POTASSIUM CHLORIDE 10 MEQ/100ML IV SOLN
10.0000 meq | INTRAVENOUS | Status: AC
  Administered 2021-11-24 (×4): 10 meq via INTRAVENOUS
  Filled 2021-11-24 (×4): qty 100

## 2021-11-24 MED ORDER — HYDROMORPHONE HCL 1 MG/ML IJ SOLN
1.0000 mg | INTRAMUSCULAR | Status: DC | PRN
  Administered 2021-11-24 – 2021-11-25 (×4): 1 mg via INTRAVENOUS
  Filled 2021-11-24 (×4): qty 1

## 2021-11-24 MED ORDER — ACETAMINOPHEN 325 MG PO TABS
650.0000 mg | ORAL_TABLET | Freq: Four times a day (QID) | ORAL | Status: DC | PRN
  Administered 2021-11-24: 650 mg via ORAL
  Filled 2021-11-24: qty 2

## 2021-11-24 MED ORDER — METHOCARBAMOL 1000 MG/10ML IJ SOLN
1000.0000 mg | Freq: Three times a day (TID) | INTRAVENOUS | Status: DC | PRN
  Administered 2021-11-24: 1000 mg via INTRAVENOUS
  Filled 2021-11-24: qty 10

## 2021-11-24 NOTE — Progress Notes (Signed)
SLP Cancellation Note  Patient Details Name: Phyllis Wilkins MRN: [id number] DOB: May 17, 1981   Cancelled treatment:        Attempted to see pt for cognitive-linguistic evaluation.  Spoke with RN.  Pain is very poorly controlled at this time and pt is not appropriate for assessment.  SLP will return when pt is medically ready for evaluation.   Kerrie Pleasure, MA, CCC-SLP Acute Rehabilitation Services Office: [phone] 11/24/2021, 9:43 AM
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
## 2021-11-24 NOTE — Procedures (Signed)
Extubation Procedure Note  Patient Details:   Name: Kailynne Ferrington DOB: December 04, 1980 MRN: [id number]   Airway Documentation:    Vent end date: 11/24/21 Vent end time: 0751   Evaluation  O2 sats: stable throughout Complications: No apparent complications Patient did tolerate procedure well. Bilateral Breath Sounds: Clear   Yes  Pt extubated to 2l [place] with RN at bedside. Positive cuff leak noted. Pt is tolerating well. RT will monitor.  Forest Becker 11/24/2021, 7:53 AM
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
## 2021-11-24 NOTE — Progress Notes (Signed)
Pt placed on PS/CPAP 5/5 and is tolerating well. RT will monitor. 

## 2021-11-24 NOTE — Plan of Care (Signed)

## 2021-11-25 ENCOUNTER — Encounter (HOSPITAL_COMMUNITY): Payer: Self-pay | Admitting: Emergency Medicine

## 2021-11-25 LAB — CBC
HCT: 37.4 % (ref 36.0–46.0)
Hemoglobin: 13 g/dL (ref 12.0–15.0)
MCH: 31.9 pg (ref 26.0–34.0)
MCHC: 34.8 g/dL (ref 30.0–36.0)
MCV: 91.7 fL (ref 80.0–100.0)
Platelets: 202 10*3/uL (ref 150–400)
RBC: 4.08 MIL/uL (ref 3.87–5.11)
RDW: 12.6 % (ref 11.5–15.5)
WBC: 7.1 10*3/uL (ref 4.0–10.5)
nRBC: 0 % (ref 0.0–0.2)

## 2021-11-25 LAB — BASIC METABOLIC PANEL
Anion gap: 8 (ref 5–15)
BUN: 9 mg/dL (ref 6–20)
CO2: 25 mmol/L (ref 22–32)
Calcium: 8.4 mg/dL — ABNORMAL LOW (ref 8.9–10.3)
Chloride: 107 mmol/L (ref 98–111)
Creatinine, Ser: 0.54 mg/dL (ref 0.44–1.00)
GFR, Estimated: >60 mL/min (ref >60)
Glucose, Bld: 96 mg/dL (ref 70–99)
Potassium: 3.2 mmol/L — ABNORMAL LOW (ref 3.5–5.1)
Sodium: 140 mmol/L (ref 135–145)

## 2021-11-25 MED ORDER — ACETAMINOPHEN 500 MG PO TABS
1000.0000 mg | ORAL_TABLET | Freq: Four times a day (QID) | ORAL | Status: DC
  Administered 2021-11-25: 1000 mg via ORAL
  Filled 2021-11-25: qty 2

## 2021-11-25 MED ORDER — POTASSIUM CHLORIDE CRYS ER 20 MEQ PO TBCR
40.0000 meq | EXTENDED_RELEASE_TABLET | ORAL | Status: DC
  Administered 2021-11-25: 40 meq via ORAL
  Filled 2021-11-25: qty 2

## 2021-11-25 MED ORDER — ACETAMINOPHEN 500 MG PO TABS
1000.0000 mg | ORAL_TABLET | Freq: Four times a day (QID) | ORAL | 0 refills | Status: AC
Start: 2021-11-25 — End: ?

## 2021-11-25 MED ORDER — OXYCODONE HCL 5 MG PO TABS: 5.0000 mg | ORAL_TABLET | ORAL | 0 refills | Status: AC | PRN

## 2021-11-25 MED ORDER — DOCUSATE SODIUM 100 MG PO CAPS: 100.0000 mg | ORAL_CAPSULE | Freq: Two times a day (BID) | ORAL | 0 refills | Status: AC

## 2021-11-25 MED ORDER — METHOCARBAMOL 500 MG PO TABS
1000.0000 mg | ORAL_TABLET | Freq: Three times a day (TID) | ORAL | Status: DC
  Administered 2021-11-25: 1000 mg via ORAL
  Filled 2021-11-25: qty 2

## 2021-11-25 MED ORDER — METHOCARBAMOL 1000 MG PO TABS: 1000.0000 mg | ORAL_TABLET | Freq: Three times a day (TID) | ORAL | 0 refills | Status: AC

## 2021-11-25 MED ORDER — OXYCODONE HCL 5 MG PO TABS: 5.0000 mg | ORAL_TABLET | ORAL | Status: DC | PRN
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
## 2021-11-25 NOTE — Progress Notes (Addendum)
D/C education given to Pt and all questions answered. No printed prescriptions to give or equipment to deliver. IVs removed. Belongings retrieved from security office.Pt taken to car with all belongings.
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
## 2021-11-25 NOTE — TOC CAGE-AID Note (Signed)
Transition of Care [facility]) - CAGE-AID Screening   Patient Details  Name: Phyllis Wilkins MRN: [id number] Date of Birth: March 02, 1981  Transition of Care [facility]) CM/SW Contact:    Coralee Pesa, [place] Phone Number: 11/25/2021, 11:12 AM   Clinical Narrative:  CSW met with pt at bedside to complete CAGE- AID assessment. Pt with flat affect, answers no to all questions, declines resources.  CAGE-AID Screening: Substance Abuse Screening unable to be completed due to: : Patient unable to participate (intubated and sedated)  Have You Ever Felt You Ought to Cut Down on Your Drinking or Drug Use?: No Have People Annoyed You By Critizing Your Drinking Or Drug Use?: No Have You Felt Bad Or Guilty About Your Drinking Or Drug Use?: No Have You Ever Had a Drink or Used Drugs First Thing In The Morning to Steady Your Nerves or to Get Rid of a Hangover?: No CAGE-AID Score: 0  Substance Abuse Education Offered: Yes
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
## 2022-09-10 ENCOUNTER — Emergency Department (HOSPITAL_BASED_OUTPATIENT_CLINIC_OR_DEPARTMENT_OTHER)
Admission: EM | Admit: 2022-09-10 | Discharge: 2022-09-10 | Discharge status: Against Medical Advice | Payer: Medicaid Other | Attending: Emergency Medicine | Admitting: Emergency Medicine

## 2022-09-10 ENCOUNTER — Other Ambulatory Visit: Payer: Self-pay

## 2022-09-10 ENCOUNTER — Encounter (HOSPITAL_BASED_OUTPATIENT_CLINIC_OR_DEPARTMENT_OTHER): Payer: Self-pay

## 2022-09-10 DIAGNOSIS — Z5321 Procedure and treatment not carried out due to patient leaving prior to being seen by health care provider: Secondary | ICD-10-CM | POA: Insufficient documentation

## 2022-09-10 DIAGNOSIS — Z202 Contact with and (suspected) exposure to infections with a predominantly sexual mode of transmission: Secondary | ICD-10-CM | POA: Insufficient documentation

## 2022-09-10 LAB — URINALYSIS, ROUTINE W REFLEX MICROSCOPIC
Bilirubin Urine: NEGATIVE
Glucose, UA: NEGATIVE mg/dL
Ketones, ur: NEGATIVE mg/dL
Leukocytes,Ua: NEGATIVE
Nitrite: NEGATIVE
Protein, ur: NEGATIVE mg/dL
Specific Gravity, Urine: 1.02 (ref 1.005–1.030)
pH: 6 (ref 5.0–8.0)

## 2022-09-10 LAB — PREGNANCY, URINE: Preg Test, Ur: NEGATIVE

## 2022-09-10 LAB — URINALYSIS, MICROSCOPIC (REFLEX)

## 2022-09-10 NOTE — ED Notes (Signed)
Pt called for room, no answer, registration states they saw her leave.

## 2022-09-10 NOTE — ED Triage Notes (Signed)
Says significant other has tested positive for Chlamydia.   Also suspects developing a stomach ulcer. Denies pain but admits to passing gas more often.
